# Patient Record
Sex: Male | Born: 1945 | Race: Asian | Hispanic: No | Marital: Married | Smoking: Never smoker
Health system: Southern US, Community
[De-identification: ages and names within clinical notes are randomized; demographics above are authoritative.]

## PROBLEM LIST (undated history)

## (undated) DIAGNOSIS — I10 Essential (primary) hypertension: Secondary | ICD-10-CM

## (undated) DIAGNOSIS — Z950 Presence of cardiac pacemaker: Secondary | ICD-10-CM

---

## 2013-06-17 DIAGNOSIS — R9431 Abnormal electrocardiogram [ECG] [EKG]: Secondary | ICD-10-CM | POA: Insufficient documentation

## 2013-08-13 DIAGNOSIS — I2510 Atherosclerotic heart disease of native coronary artery without angina pectoris: Secondary | ICD-10-CM | POA: Insufficient documentation

## 2013-08-13 DIAGNOSIS — E785 Hyperlipidemia, unspecified: Secondary | ICD-10-CM | POA: Insufficient documentation

## 2013-08-13 DIAGNOSIS — I447 Left bundle-branch block, unspecified: Secondary | ICD-10-CM | POA: Insufficient documentation

## 2013-08-13 DIAGNOSIS — I429 Cardiomyopathy, unspecified: Secondary | ICD-10-CM | POA: Insufficient documentation

## 2014-04-20 DIAGNOSIS — I6381 Other cerebral infarction due to occlusion or stenosis of small artery: Secondary | ICD-10-CM | POA: Insufficient documentation

## 2015-05-18 DIAGNOSIS — I498 Other specified cardiac arrhythmias: Secondary | ICD-10-CM | POA: Insufficient documentation

## 2018-08-13 DIAGNOSIS — Z950 Presence of cardiac pacemaker: Secondary | ICD-10-CM

## 2018-08-13 HISTORY — DX: Presence of cardiac pacemaker: Z95.0

## 2018-09-13 ENCOUNTER — Encounter (HOSPITAL_COMMUNITY): Payer: Self-pay

## 2018-09-13 ENCOUNTER — Emergency Department (HOSPITAL_COMMUNITY): Payer: Medicare Other

## 2018-09-13 ENCOUNTER — Observation Stay (HOSPITAL_COMMUNITY)
Admission: EM | Admit: 2018-09-13 | Discharge: 2018-09-14 | Disposition: A | Discharge status: Home / Self Care | Payer: Medicare Other | Attending: Family Medicine | Admitting: Family Medicine

## 2018-09-13 ENCOUNTER — Ambulatory Visit (HOSPITAL_COMMUNITY)
Admission: EM | Admit: 2018-09-13 | Discharge: 2018-09-13 | Disposition: A | Discharge status: Home / Self Care | Payer: Medicare Other | Source: Home / Self Care

## 2018-09-13 ENCOUNTER — Other Ambulatory Visit: Payer: Self-pay

## 2018-09-13 DIAGNOSIS — Z950 Presence of cardiac pacemaker: Secondary | ICD-10-CM | POA: Diagnosis not present

## 2018-09-13 DIAGNOSIS — I2510 Atherosclerotic heart disease of native coronary artery without angina pectoris: Secondary | ICD-10-CM | POA: Diagnosis not present

## 2018-09-13 DIAGNOSIS — I10 Essential (primary) hypertension: Secondary | ICD-10-CM | POA: Diagnosis not present

## 2018-09-13 DIAGNOSIS — R42 Dizziness and giddiness: Secondary | ICD-10-CM | POA: Diagnosis present

## 2018-09-13 DIAGNOSIS — Z7982 Long term (current) use of aspirin: Secondary | ICD-10-CM | POA: Insufficient documentation

## 2018-09-13 DIAGNOSIS — D1779 Benign lipomatous neoplasm of other sites: Secondary | ICD-10-CM | POA: Insufficient documentation

## 2018-09-13 DIAGNOSIS — M419 Scoliosis, unspecified: Secondary | ICD-10-CM | POA: Insufficient documentation

## 2018-09-13 DIAGNOSIS — Z79899 Other long term (current) drug therapy: Secondary | ICD-10-CM | POA: Insufficient documentation

## 2018-09-13 DIAGNOSIS — G459 Transient cerebral ischemic attack, unspecified: Secondary | ICD-10-CM | POA: Diagnosis not present

## 2018-09-13 DIAGNOSIS — F329 Major depressive disorder, single episode, unspecified: Secondary | ICD-10-CM | POA: Insufficient documentation

## 2018-09-13 DIAGNOSIS — Z8679 Personal history of other diseases of the circulatory system: Secondary | ICD-10-CM | POA: Insufficient documentation

## 2018-09-13 DIAGNOSIS — R202 Paresthesia of skin: Secondary | ICD-10-CM | POA: Diagnosis not present

## 2018-09-13 DIAGNOSIS — N400 Enlarged prostate without lower urinary tract symptoms: Secondary | ICD-10-CM | POA: Insufficient documentation

## 2018-09-13 DIAGNOSIS — G309 Alzheimer's disease, unspecified: Secondary | ICD-10-CM | POA: Insufficient documentation

## 2018-09-13 DIAGNOSIS — F32A Depression, unspecified: Secondary | ICD-10-CM | POA: Insufficient documentation

## 2018-09-13 HISTORY — DX: Essential (primary) hypertension: I10

## 2018-09-13 HISTORY — DX: Presence of cardiac pacemaker: Z95.0

## 2018-09-13 LAB — COMPREHENSIVE METABOLIC PANEL
ALBUMIN: 3.7 g/dL (ref 3.5–5.0)
ALK PHOS: 50 U/L (ref 38–126)
ALT: 20 U/L (ref 0–44)
AST: 30 U/L (ref 15–41)
Anion gap: 9 (ref 5–15)
BUN: 13 mg/dL (ref 8–23)
CALCIUM: 9.1 mg/dL (ref 8.9–10.3)
CO2: 25 mmol/L (ref 22–32)
Chloride: 108 mmol/L (ref 98–111)
Creatinine, Ser: 1.16 mg/dL (ref 0.61–1.24)
GFR calc Af Amer: >60 mL/min (ref >60)
GFR calc non Af Amer: >60 mL/min (ref >60)
GLUCOSE: 94 mg/dL (ref 70–99)
POTASSIUM: 3.9 mmol/L (ref 3.5–5.1)
Sodium: 142 mmol/L (ref 135–145)
Total Bilirubin: 0.9 mg/dL (ref 0.3–1.2)
Total Protein: 6.7 g/dL (ref 6.5–8.1)

## 2018-09-13 LAB — I-STAT CHEM 8, ED
BUN: 15 mg/dL (ref 8–23)
CREATININE: 1.1 mg/dL (ref 0.61–1.24)
Calcium, Ion: 1.2 mmol/L (ref 1.15–1.40)
Chloride: 106 mmol/L (ref 98–111)
Glucose, Bld: 90 mg/dL (ref 70–99)
HCT: 43 % (ref 39.0–52.0)
Hemoglobin: 14.6 g/dL (ref 13.0–17.0)
Potassium: 3.8 mmol/L (ref 3.5–5.1)
Sodium: 142 mmol/L (ref 135–145)
TCO2: 24 mmol/L (ref 22–32)

## 2018-09-13 LAB — DIFFERENTIAL
ABS IMMATURE GRANULOCYTES: 0 10*3/uL (ref 0.0–0.1)
Basophils Absolute: 0 10*3/uL (ref 0.0–0.1)
Basophils Relative: 1 %
Eosinophils Absolute: 0.2 10*3/uL (ref 0.0–0.7)
Eosinophils Relative: 3 %
IMMATURE GRANULOCYTES: 0 %
LYMPHS PCT: 34 %
Lymphs Abs: 2.7 10*3/uL (ref 0.7–4.0)
Monocytes Absolute: 0.7 10*3/uL (ref 0.1–1.0)
Monocytes Relative: 9 %
NEUTROS ABS: 4.3 10*3/uL (ref 1.7–7.7)
Neutrophils Relative %: 53 %

## 2018-09-13 LAB — CBC
HEMATOCRIT: 44.2 % (ref 39.0–52.0)
Hemoglobin: 14.6 g/dL (ref 13.0–17.0)
MCH: 29.1 pg (ref 26.0–34.0)
MCHC: 33 g/dL (ref 30.0–36.0)
MCV: 88.2 fL (ref 78.0–100.0)
Platelets: 210 10*3/uL (ref 150–400)
RBC: 5.01 MIL/uL (ref 4.22–5.81)
RDW: 12.4 % (ref 11.5–15.5)
WBC: 8 10*3/uL (ref 4.0–10.5)

## 2018-09-13 LAB — I-STAT TROPONIN, ED: Troponin i, poc: 0.04 ng/mL (ref 0.00–0.08)

## 2018-09-13 LAB — APTT: aPTT: 31 seconds (ref 24–36)

## 2018-09-13 LAB — PROTIME-INR
INR: 1.12
Prothrombin Time: 14.3 seconds (ref 11.4–15.2)

## 2018-09-13 MED ORDER — INFLUENZA VAC SPLIT HIGH-DOSE 0.5 ML IM SUSY
0.5000 mL | PREFILLED_SYRINGE | INTRAMUSCULAR | Status: DC
  Filled 2018-09-13: qty 0.5

## 2018-09-13 MED ORDER — ASPIRIN 325 MG PO TABS
325.0000 mg | ORAL_TABLET | Freq: Every day | ORAL | Status: DC
  Administered 2018-09-13 – 2018-09-14 (×2): 325 mg via ORAL
  Filled 2018-09-13 (×2): qty 1

## 2018-09-13 MED ORDER — ENOXAPARIN SODIUM 40 MG/0.4ML ~~LOC~~ SOLN
40.0000 mg | SUBCUTANEOUS | Status: DC
  Administered 2018-09-13: 40 mg via SUBCUTANEOUS
  Filled 2018-09-13: qty 0.4

## 2018-09-13 MED ORDER — MEMANTINE HCL 5 MG PO TABS
5.0000 mg | ORAL_TABLET | Freq: Two times a day (BID) | ORAL | Status: DC
  Administered 2018-09-13 – 2018-09-14 (×2): 5 mg via ORAL
  Filled 2018-09-13 (×3): qty 1

## 2018-09-13 MED ORDER — STROKE: EARLY STAGES OF RECOVERY BOOK
Freq: Once | Status: AC
  Administered 2018-09-13: 22:00:00
  Filled 2018-09-13: qty 1

## 2018-09-13 MED ORDER — LOSARTAN POTASSIUM 50 MG PO TABS: 50.0000 mg | ORAL_TABLET | Freq: Every day | ORAL | Status: DC

## 2018-09-13 MED ORDER — ASPIRIN 300 MG RE SUPP: 300.0000 mg | Freq: Every day | RECTAL | Status: DC

## 2018-09-13 MED ORDER — ACETAMINOPHEN 650 MG RE SUPP: 650.0000 mg | RECTAL | Status: DC | PRN

## 2018-09-13 MED ORDER — ACETAMINOPHEN 325 MG PO TABS: 650.0000 mg | ORAL_TABLET | ORAL | Status: DC | PRN

## 2018-09-13 MED ORDER — ACETAMINOPHEN 160 MG/5ML PO SOLN: 650.0000 mg | ORAL | Status: DC | PRN

## 2018-09-13 MED ORDER — HYDRALAZINE HCL 20 MG/ML IJ SOLN
2.0000 mg | INTRAMUSCULAR | Status: DC | PRN
  Administered 2018-09-13: 2 mg via INTRAVENOUS
  Filled 2018-09-13: qty 1

## 2018-09-13 MED ORDER — PNEUMOCOCCAL VAC POLYVALENT 25 MCG/0.5ML IJ INJ: 0.5000 mL | INJECTION | INTRAMUSCULAR | Status: DC

## 2018-09-13 NOTE — ED Notes (Signed)
Per admitting MD, neuro to be consulted before BP meds ordered. MD informed of MRI being cancelled.

## 2018-09-13 NOTE — ED Notes (Signed)
Pacemaker interrogated. 

## 2018-09-13 NOTE — H&P (Addendum)
[facility] Admission History and Physical Service Pager: [phone]  Patient name: Douglas Duffy Medical record number: [id number] Date of birth: 01-Apr-1946 Age: 72 y.o. Gender: male  Primary Care Provider: Patient, No Pcp Per Consultants: Neurology Code Status: Full (confirmed in ED)  Chief Complaint: dizzy, L left weakness  Assessment and Plan: Douglas Duffy is a 72 y.o. male presenting with dizziness and left leg weakness. PMH is significant for HTN, CAD,  bradyarrhythmia s/p pacemaker, h/o first degree heart block, cardiomyopathy, scoliosis, Alzheimer's disease, chronic back pain, BPH.   TIA: Had 10 min episode where he felt he could not use leg with weakness and numbness. MRI 10/2016 showed small chronic infarction in the left basal ganglia first noted on MRI in 2014. Patient unable to have MRI imaging here with recent pacemaker placement. CT head w/o with signs of old infarct and no hemorrhage. No focal deficits on exam. Patient at baseline per wife. Has mild dementia but still able to perform all ADL's. Patient compliant with ASA 81 at home and zocor 10 daily. Risk factor for stroke is HTN.  - Admit to telemetry, attending Dr. Erin Hearing - Neuro consulted in ED, appreciate recs - MRI brain unable to be obtained as patient had pacer placed 4 weeks ago (must be 8 weeks) - Patient will likely need CTA given inability to obtain MRI, will await neuro recs - Regular diet, passed swallow screen - vitals per unit - PT/OT/SLP to eval and treat  - Await neuro recs regarding permissive htn in absence on MRI imaging- Hydral 2 mg IV prn for >200/>110 - ECHO - Lipid panel, A1c in am   HTN: BP up to 217/90 in ED. Patient on losartan at home. - Continue home losartan, pending changes from neuro - Hydral 2 mg IV prn for >200/>110.  - Await neuro recs regarding permissive htn in absence on MRI imaging - Monitor BP  CAD: Per chart review. Patient on ASA 81  daily - ASA 325 mg - neuro recs for DAPT  Bradyarrhythmia s/p pacemaker  h/o 1st degree heart block: - Telemetry monitoring  Alzheimer's Disease: Patient with mild symptoms. Performs all ADL's. Follows in [place]. Baseline appears to be patient repeats himself often but is A&Ox3.  - Monitor for changes in mentation  Scoliosis with back pain: Not on any chronic medication - Tylenol prn  BPH: per chart review. Patient's wife reports she is unaware of this diagnosis. - Monitor intake and output  HLD: On simvastatin 10mg  and omega 3's - Increase to high intensity statin rosuvastatin 20mg   - Lipid panel in am  FEN/GI: NPO pending swallow study Prophylaxis: Lovenox  Disposition: admit to telemetry  History of Present Illness:  Douglas Duffy is a 72 y.o. male presenting with dizziness and lightheadedness earlier today. HE was sitting in a chair and started to feel dizzy and lightheaded. He went to stand and his left leg was weak and numb. He started to try to walk quickly across the floor and after about 10 minutes, his leg was normal again. Denies any speech changes, chest pain, sob. Patient does not remember this ever happening before but wife reports he has had a TIA in the past, maybe in 2014.   Patient is from [place] and is visiting his daughter for 2 weeks. Recently on a flight. Patient has been complaint with all his home meds and took his medication this morning as he normally does.   Review Of Systems: Per HPI  with the following additions:   Review of Systems  Constitutional: Negative for chills and fever.  HENT: Negative for sore throat.   Eyes: Negative for blurred vision and double vision.  Respiratory: Negative for cough and shortness of breath.   Cardiovascular: Negative for chest pain.  Gastrointestinal: Negative for constipation, diarrhea, nausea and vomiting.  Genitourinary: Negative for dysuria.  Musculoskeletal: Negative for falls.  Neurological: Positive for  dizziness, sensory change, focal weakness and weakness. Negative for tingling, speech change, loss of consciousness and headaches.    Patient Active Problem List   Diagnosis Date Noted  . TIA (transient ischemic attack) 09/13/2018  . Benign prostatic hyperplasia 09/13/2018  . 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 Depression 09/13/2018  . HTN (hypertension) 09/13/2018  . Scoliosis 09/13/2018  . Bradyarrhythmia 05/18/2015  . Multiple lacunar infarcts ([place]) 04/20/2014  . CAD (coronary artery disease) 08/13/2013  . Cardiomyopathy ([place]) 08/13/2013  . Hyperlipidemia 08/13/2013  . LBBB (left bundle branch block) 08/13/2013  . Abnormal EKG 06/17/2013   Past Medical History: Past Medical History:  Diagnosis Date  . Hypertension   . Pacemaker 08/13/2018    Past Surgical History: History reviewed. No pertinent surgical history.  Social History: Social History   Tobacco Use  . Smoking status: Never Smoker  Substance Use Topics  . Alcohol use: Yes    Frequency: Never    Comment: rare  . Drug use: Never   Additional social history:  Drinks a beer per week, never smoker, never illicit drug user Please also refer to relevant sections of EMR.  Family History: Brother Deceased (at age 18) Alcohol (substance abuse), Cancer, Other, Heart Disease, Depression  Father Deceased (at age 82) (MI) CAD, Alcohol  Maternal Grandmother Deceased(at age 23)  Maternal Uncle Deceased (at age 95)  Mother Deceased (at age 94) (enlarged heart) Heart Disease  Sister Deceased HIV No History Breast Cancer, Colorectal Cancer, Diabetes, Prostate Cancer, Hypertension, Cancer, Prostate   Allergies and Medications: Allergies  Allergen Reactions  . Lobster [Shellfish Allergy] Itching   No current facility-administered medications on file prior to encounter.    No current outpatient medications on file prior to encounter.    Objective: BP (!) 205/88 (BP Location: Right Arm)   Pulse (!) 54   Temp 98.1 F (36.7  C) (Oral)   Resp 20   Ht 5\' 8"  (1.727 m)   Wt 67.6 kg   SpO2 96%   BMI 22.66 kg/m  Exam: General: NAD, pleasant Eyes: PERRL, EOMI, no conjunctival pallor or injection ENTM: Moist mucous membranes, no pharyngeal erythema or exudate Neck: Supple, no LAD Cardiovascular: RRR, no m/r/g, no LE edema Respiratory: CTA BL, normal work of breathing Gastrointestinal: soft, nontender, nondistended, normoactive BS MSK: moves 4 extremities equally Derm: no rashes appreciated Neuro: CN II-XII grossly intact, 5/5 strength in BLUE and BLLE, gross sensation intact throughout in BLLE and BLUE Psych: AOx3, appropriate affect  Labs and Imaging: CBC BMET  Recent Labs  Lab 09/13/18 1443 09/13/18 1505  WBC 8.0  --   HGB 14.6 14.6  HCT 44.2 43.0  PLT 210  --    Recent Labs  Lab 09/13/18 1443 09/13/18 1505  NA 142 142  K 3.9 3.8  CL 108 106  CO2 25  --   BUN 13 15  CREATININE 1.16 1.10  GLUCOSE 94 90  CALCIUM 9.1  --     Ct Head Wo Contrast  Result Date: 09/13/2018 CLINICAL DATA:  Lightheaded, fall EXAM: CT HEAD WITHOUT CONTRAST TECHNIQUE:  Contiguous axial images were obtained from the base of the skull through the vertex without intravenous contrast. COMPARISON:  None. FINDINGS: Brain: No evidence of acute infarction, hemorrhage, hydrocephalus, extra-axial collection or mass lesion/mass effect. Subcortical white matter and periventricular small vessel ischemic changes. Global cortical atrophy. Old left basal ganglia lacunar infarct. Vascular: Intracranial atherosclerosis. Skull: Normal. Negative for fracture or focal lesion. Sinuses/Orbits: The visualized paranasal sinuses are essentially clear. The mastoid air cells are unopacified. Other: None. IMPRESSION: No evidence of acute intracranial abnormality. Atrophy with small vessel ischemic changes. Old left basal ganglia lacunar infarct. Electronically Signed   By: Julian Hy M.D.   On: 09/13/2018 15:30     Mairi Stagliano, [place],  DO 09/13/2018, 6:19 PM PGY-2, [place] Intern pager: [phone], text pages welcome

## 2018-09-13 NOTE — ED Provider Notes (Signed)
[facility] EMERGENCY DEPARTMENT Provider Note   CSN: [id number] Arrival date & time: 09/13/18  1421     History   Chief Complaint Chief Complaint  Patient presents with  . Dizziness  . Left Leg Weakness    HPI Douglas Duffy is a 72 y.o. male.  Patient with history of bradycardia arrhythmia status post pacemaker, hypertension who presents to the ED with strokelike symptoms.  Patient states that earlier today he had an episode of dizziness and left leg weakness with sensation changes that lasted about 10 minutes and then resolved.  Patient has a history of dementia but also has had TIAs in the past according to his wife.  Patient had recent pacemaker placed in [place] where he is from.  He denies any chest pain, shortness of breath.  Patient is not on any blood thinners.  Patient did not state he felt like he was going to pass out but rather that the room is spinning.  The history is provided by the patient.  Neurologic Problem  This is a new problem. The current episode started 6 to 12 hours ago. The problem occurs rarely. The problem has been resolved. Pertinent negatives include no chest pain, no abdominal pain, no headaches and no shortness of breath. Nothing aggravates the symptoms. The symptoms are relieved by rest. He has tried nothing for the symptoms. The treatment provided no relief.    Past Medical History:  Diagnosis Date  . Hypertension   . Pacemaker 08/13/2018    Patient Active Problem List   Diagnosis Date Noted  . TIA (transient ischemic attack) 09/13/2018  . Benign prostatic hyperplasia 09/13/2018  . 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 Depression 09/13/2018  . HTN (hypertension) 09/13/2018  . Scoliosis 09/13/2018  . Bradyarrhythmia 05/18/2015  . Multiple lacunar infarcts ([place]) 04/20/2014  . CAD (coronary artery disease) 08/13/2013  . Cardiomyopathy ([place]) 08/13/2013  . Hyperlipidemia 08/13/2013  . LBBB (left bundle branch block) 08/13/2013    . Abnormal EKG 06/17/2013    History reviewed. No pertinent surgical history.      Home Medications    Prior to Admission medications   Medication Sig Start Date End Date Taking? Authorizing Provider  aspirin EC 81 MG tablet Take 81 mg by mouth daily.   Yes [provider]  Cholecalciferol (VITAMIN D) 2000 units tablet Take 2,000 Units by mouth daily.   Yes [provider]  losartan (COZAAR) 50 MG tablet Take 50 mg by mouth daily. 07/10/18  Yes [provider]  memantine (NAMENDA) 5 MG tablet Take 5 mg by mouth 2 (two) times daily. 07/19/18  Yes [provider]  Omega-3 Fatty Acids (FISH OIL) 1000 MG CAPS Take 1,000 mg by mouth daily.   Yes [provider]  simvastatin (ZOCOR) 10 MG tablet Take 10 mg by mouth at bedtime. 09/08/18  Yes [provider]  TURMERIC PO Take 1 tablet by mouth daily.   Yes [provider]    Family History History reviewed. No pertinent family history.  Social History Social History   Tobacco Use  . Smoking status: Never Smoker  Substance Use Topics  . Alcohol use: Yes    Frequency: Never    Comment: rare  . Drug use: Never     Allergies   Lobster [shellfish allergy] and Ace inhibitors   Review of Systems Review of Systems  Constitutional: Negative for chills and fever.  HENT: Negative for ear pain and sore throat.   Eyes:  Negative for pain and visual disturbance.  Respiratory: Negative for cough and shortness of breath.   Cardiovascular: Negative for chest pain and palpitations.  Gastrointestinal: Negative for abdominal pain and vomiting.  Genitourinary: Negative for dysuria and hematuria.  Musculoskeletal: Negative for arthralgias and back pain.  Skin: Negative for color change and rash.  Neurological: Positive for dizziness, weakness (LLE) and numbness (LLE). Negative for tremors, seizures, syncope, facial asymmetry, speech difficulty, light-headedness and headaches.  All  other systems reviewed and are negative.    Physical Exam Updated Vital Signs  ED Triage Vitals  Enc Vitals Group     BP 09/13/18 1427 (!) 173/93     Pulse Rate 09/13/18 1427 (!) 50     Resp 09/13/18 1427 18     Temp 09/13/18 1427 98.1 F (36.7 C)     Temp Source 09/13/18 1427 Oral     SpO2 09/13/18 1427 97 %     Weight 09/13/18 1431 149 lb (67.6 kg)     Height 09/13/18 1431 5\' 8"  (1.727 m)     Head Circumference --      Peak Flow --      Pain Score 09/13/18 1431 0     Pain Loc --      Pain Edu? --      Excl. in [place]? --     Physical Exam  Constitutional: He is oriented to person, place, and time. He appears well-developed and well-nourished.  HENT:  Head: Normocephalic and atraumatic.  Eyes: Pupils are equal, round, and reactive to light. Conjunctivae and EOM are normal.  Neck: Normal range of motion. Neck supple.  Cardiovascular: Normal rate, regular rhythm, normal heart sounds and intact distal pulses.  No murmur heard. Pulmonary/Chest: Effort normal and breath sounds normal. No respiratory distress.  Abdominal: Soft. There is no tenderness.  Musculoskeletal: Normal range of motion. He exhibits no edema.  Neurological: He is alert and oriented to person, place, and time. No cranial nerve deficit or sensory deficit. He exhibits normal muscle tone. Coordination normal.  5+/5 strength, normal sensation, no drift, normal finger to nose finger  Skin: Skin is warm and dry.  Psychiatric: He has a normal mood and affect.  Nursing note and vitals reviewed.    ED Treatments / Results  Labs (all labs ordered are listed, but only abnormal results are displayed) Labs Reviewed  PROTIME-INR  APTT  CBC  DIFFERENTIAL  COMPREHENSIVE METABOLIC PANEL  HEMOGLOBIN A1C  LIPID PANEL  I-STAT TROPONIN, ED  I-STAT CHEM 8, ED  CBG MONITORING, ED    EKG EKG Interpretation  Date/Time:  Saturday September 13 2018 14:38:06 EDT Ventricular Rate:  50 PR Interval:  286 QRS  Duration: 142 QT Interval:  454 QTC Calculation: 413 R Axis:   -49 Text Interpretation:  Atrial-paced rhythm with prolonged AV conduction Left axis deviation Left bundle branch block Abnormal ECG Confirmed by Lennice Sites [phone]) on 09/13/2018 5:01:59 PM   Radiology Ct Head Wo Contrast  Result Date: 09/13/2018 CLINICAL DATA:  Lightheaded, fall EXAM: CT HEAD WITHOUT CONTRAST TECHNIQUE: Contiguous axial images were obtained from the base of the skull through the vertex without intravenous contrast. COMPARISON:  None. FINDINGS: Brain: No evidence of acute infarction, hemorrhage, hydrocephalus, extra-axial collection or mass lesion/mass effect. Subcortical white matter and periventricular small vessel ischemic changes. Global cortical atrophy. Old left basal ganglia lacunar infarct. Vascular: Intracranial atherosclerosis. Skull: Normal. Negative for fracture or focal lesion. Sinuses/Orbits: The visualized paranasal sinuses are essentially clear. The mastoid air  cells are unopacified. Other: None. IMPRESSION: No evidence of acute intracranial abnormality. Atrophy with small vessel ischemic changes. Old left basal ganglia lacunar infarct. Electronically Signed   By: Julian Hy M.D.   On: 09/13/2018 15:30    Procedures Procedures (including critical care time)  Medications Ordered in ED Medications  memantine (NAMENDA) tablet 5 mg (5 mg Oral Given 09/13/18 2152)  losartan (COZAAR) tablet 50 mg (has no administration in time range)  acetaminophen (TYLENOL) tablet 650 mg (has no administration in time range)    Or  acetaminophen (TYLENOL) solution 650 mg (has no administration in time range)    Or  acetaminophen (TYLENOL) suppository 650 mg (has no administration in time range)  enoxaparin (LOVENOX) injection 40 mg (40 mg Subcutaneous Given 09/13/18 2152)  aspirin suppository 300 mg ( Rectal See Alternative 09/13/18 2201)    Or  aspirin tablet 325 mg (325 mg Oral Given 09/13/18 2201)   hydrALAZINE (APRESOLINE) injection 2 mg (2 mg Intravenous Given 09/13/18 2153)   stroke: mapping our early stages of recovery book ( Does not apply Given 09/13/18 2153)     Initial Impression / Assessment and Plan / ED Course  I have reviewed the triage vital signs and the nursing notes.  Pertinent labs & imaging results that were available during my care of the patient were reviewed by me and considered in my medical decision making (see chart for details).     Douglas Duffy is a 72 year old male with history of dementia, hypertension, status post pacemaker for bradycardia arrhythmia who presents to the ED with strokelike symptoms.  Patient with mild hypertension upon arrival but otherwise normal vitals.  Patient with episode of dizziness, left lower leg weakness and numbness that resolved after 10 minutes.  Patient has a history of TIAs in the past. Patient is visiting from [place].  He had a pacemaker placed several weeks ago.  He denies any chest pain, shortness of breath.  Patient is asymptomatic upon my evaluation.  He is neurologically intact.  Patient already with lab work and head CT done prior to my evaluation.  Head CT shows no acute findings.  No significant electrolyte abnormality, kidney injury, leukocytosis.  Patient with troponin within normal limits.  EKG showed atrial paced rhythm.  No prior to compare to.  Pacemaker was evaluated and showed no acute findings after talking with rep from [employer].  Doubt cardiac cause.  Concern for possible TIA and patient to be admitted for TIA work-up.  MRI of the head and brain were ordered but may need pacer clearance.  Neurologist was consulted as well and will come down to the ED to evaluate the patient.  Patient admitted to medicine service for further care.  Final Clinical Impressions(s) / ED Diagnoses   Final diagnoses:  TIA (transient ischemic attack)    ED Discharge Orders    None       Lennice Sites, DO 09/13/18 2221

## 2018-09-13 NOTE — Consult Note (Signed)
Referring Physician: Dr. Erin Hearing    Chief Complaint: Dizziness and transient left leg numbness with weakness  HPI: Douglas Duffy is an 72 y.o. male who presented to the ED this evening with dizziness and LLE weakness/numbness. The episode started at noon and lasted 10-15 minutes, during which he could not "use his leg" due to weakness in addition to a sensation of numbness. He has had no further episodes. Exam by [facility] Medicine team in the ED was nonfocal. He was noted to be hypertensive in the ED. BPs have ranged from 173/93 to 217/92.   Home medications include ASA 81 mg qd and Zocor.   CT head in the ED shows no evidence of acute intracranial abnormality. There is atrophy with small vessel ischemic changes and an old left basal ganglia lacunar infarct, which was also visible on the prior MRI from November 2017. 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  MRI cannot be obtained due to pacemaker placement 4 weeks ago. If it is MRI compatible, an MRI can be obtained after an additional 4 weeks has elapsed.   Neurology was consulted for possible TIA.   Past Medical History:  Diagnosis Date  . Hypertension   . Pacemaker 08/13/2018    History reviewed. No pertinent surgical history.  History reviewed. No pertinent family history. Social History:  reports that he has never smoked. He does not have any smokeless tobacco history on file. He reports that he drinks alcohol. He reports that he does not use drugs.  Allergies:  Allergies  Allergen Reactions  . Lobster [Shellfish Allergy] Itching  . Ace Inhibitors Cough    Currently taking losartan    Medications:  Prior to Admission:  Medications Prior to Admission  Medication Sig Dispense Refill Last Dose  . aspirin EC 81 MG tablet Take 81 mg by mouth daily.   09/13/2018 at am  . Cholecalciferol (VITAMIN D) 2000 units tablet Take 2,000 Units by  mouth daily.   09/12/2018 at Unknown time  . losartan (COZAAR) 50 MG tablet Take 50 mg by mouth daily.  0 09/13/2018 at am  . memantine (NAMENDA) 5 MG tablet Take 5 mg by mouth 2 (two) times daily.  0 09/13/2018 at am  . Omega-3 Fatty Acids (FISH OIL) 1000 MG CAPS Take 1,000 mg by mouth daily.   09/12/2018 at Unknown time  . simvastatin (ZOCOR) 10 MG tablet Take 10 mg by mouth at bedtime.  0 09/12/2018 at pm  . TURMERIC PO Take 1 tablet by mouth daily.   09/12/2018 at Unknown time   Scheduled: . aspirin  300 mg Rectal Daily   Or  . aspirin  325 mg Oral Daily  . enoxaparin (LOVENOX) injection  40 mg Subcutaneous Q24H  . [START ON 09/14/2018] Influenza vac split quadrivalent PF  0.5 mL Intramuscular Tomorrow-1000  . [START ON 09/14/2018] losartan  50 mg Oral Daily  . memantine  5 mg Oral BID  . [START ON 09/14/2018] pneumococcal 23 valent vaccine  0.5 mL Intramuscular Tomorrow-1000    ROS: No headache, vision change, dysphasia, facial droop, upper extremity weakness or CP. Other ROS as per HPI.   Physical Examination: Blood pressure (!) 212/92, pulse (!) 51, temperature 97.7 F (36.5 C), temperature source Oral, resp. rate 18, height _0  (1.727 m), weight 67.6 kg, SpO2 98 %.  HEENT: [place]/AT Lungs: Respirations unlabored Ext: No edema. Warm and well perfused all 4  extremities.   Neurologic Examination: Mental Status: Alert. Oriented ro self and hospital, not city, state, year, month or day. Pleasant and cooperative. Often repeats the same information during interview. Speech occasionally hesitant but without errors of grammar or syntax. Has trouble with repetition and response is significantly delayed when he correctly follows a 3-step directional command. Visual identification and naming intact for thumb and pinky; identifies but cannot name index finger. Cranial Nerves: II:  Visual fields intact without extinction to DSS. PERRL.   III,IV, VI: ptosis not present, EOMI without nystagmus.  V,VII:  Temp sensation equal bilaterally. No facial droop.  VIII: Hearing intact to voice.  IX,X: No hypophonia/hoarseness XI:  Symmetric XII: midline tongue extension  Motor: Right : Upper extremity   5/5    Left:     Upper extremity   5/5  Lower extremity   5/5     Lower extremity   5/5 Normal tone throughout; no atrophy noted No pronator drift.  Sensory: Temp and light touch intact proximally and distally all 4 extremities. Focused exam of LLE sensation is normal.  No extinction.  Deep Tendon Reflexes:  2+ bilateral biceps, brachioradialis, patellae and achilles.  Toes downgoing bilaterally.  Cerebellar: No ataxia with FNF and H-S bilaterally.  Gait: Deferred  Results for orders placed or performed during the hospital encounter of 09/13/18 (from the past 48 hour(s))  Protime-INR     Status: None   Collection Time: 09/13/18  2:43 PM  Result Value Ref Range   Prothrombin Time 14.3 11.4 - 15.2 seconds   INR 1.12     Comment: Performed at [facility], [place] [address], [place], [place] [address]  APTT     Status: None   Collection Time: 09/13/18  2:43 PM  Result Value Ref Range   aPTT 31 24 - 36 seconds    Comment: Performed at [facility] [address] [place] [address]  CBC     Status: None   Collection Time: 09/13/18  2:43 PM  Result Value Ref Range   WBC 8.0 4.0 - 10.5 K/uL   RBC 5.01 4.22 - 5.81 MIL/uL   Hemoglobin 14.6 13.0 - 17.0 g/dL   HCT 44.2 39.0 - 52.0 %   MCV 88.2 78.0 - 100.0 fL   MCH 29.1 26.0 - 34.0 pg   MCHC 33.0 30.0 - 36.0 g/dL   RDW 12.4 11.5 - 15.5 %   Platelets 210 150 - 400 K/uL    Comment: Performed at [facility] [address], [place], [place] [address]  Differential     Status: None   Collection Time: 09/13/18  2:43 PM  Result Value Ref Range   Neutrophils Relative % 53 %   Neutro Abs 4.3 1.7 - 7.7 K/uL   Lymphocytes Relative 34 %   Lymphs Abs 2.7 0.7 - 4.0 K/uL   Monocytes Relative 9 %   Monocytes Absolute 0.7 0.1 -  1.0 K/uL   Eosinophils Relative 3 %   Eosinophils Absolute 0.2 0.0 - 0.7 K/uL   Basophils Relative 1 %   Basophils Absolute 0.0 0.0 - 0.1 K/uL   Immature Granulocytes 0 %   Abs Immature Granulocytes 0.0 0.0 - 0.1 K/uL    Comment: Performed at [facility] [address] [place], [place] [address]  Comprehensive metabolic panel     Status: None   Collection Time: 09/13/18  2:43 PM  Result Value Ref Range  Sodium 142 135 - 145 mmol/L   Potassium 3.9 3.5 - 5.1 mmol/L   Chloride 108 98 - 111 mmol/L   CO2 25 22 - 32 mmol/L   Glucose, Bld 94 70 - 99 mg/dL   BUN 13 8 - 23 mg/dL   Creatinine, Ser 1.16 0.61 - 1.24 mg/dL   Calcium 9.1 8.9 - 10.3 mg/dL   Total Protein 6.7 6.5 - 8.1 g/dL   Albumin 3.7 3.5 - 5.0 g/dL   AST 30 15 - 41 U/L   ALT 20 0 - 44 U/L   Alkaline Phosphatase 50 38 - 126 U/L   Total Bilirubin 0.9 0.3 - 1.2 mg/dL   GFR calc non Af Amer >60 >60 mL/min   GFR calc Af Amer >60 >60 mL/min    Comment: (NOTE) The eGFR has been calculated using the CKD EPI equation. This calculation has not been validated in all clinical situations. eGFR's persistently <60 mL/min signify possible Chronic Kidney Disease.    Anion gap 9 5 - 15    Comment: Performed at [facility] [address] [place], [place] [address]  I-stat troponin, ED     Status: None   Collection Time: 09/13/18  3:03 PM  Result Value Ref Range   Troponin i, poc 0.04 0.00 - 0.08 ng/mL   Comment 3            Comment: Due to the release kinetics of cTnI, a negative result within the first hours of the onset of symptoms does not rule out myocardial infarction with certainty. If myocardial infarction is still suspected, repeat the test at appropriate intervals.   I-Stat Chem 8, ED     Status: None   Collection Time: 09/13/18  3:05 PM  Result Value Ref Range   Sodium 142 135 - 145 mmol/L   Potassium 3.8 3.5 - 5.1 mmol/L   Chloride 106 98 - 111 mmol/L   BUN 15 8 - 23 mg/dL   Creatinine, Ser 1.10  0.61 - 1.24 mg/dL   Glucose, Bld 90 70 - 99 mg/dL   Calcium, Ion 1.20 1.15 - 1.40 mmol/L   TCO2 24 22 - 32 mmol/L   Hemoglobin 14.6 13.0 - 17.0 g/dL   HCT 43.0 39.0 - 52.0 %   Ct Head Wo Contrast  Result Date: 09/13/2018 CLINICAL DATA:  Lightheaded, fall EXAM: CT HEAD WITHOUT CONTRAST TECHNIQUE: Contiguous axial images were obtained from the base of the skull through the vertex without intravenous contrast. COMPARISON:  None. FINDINGS: Brain: No evidence of acute infarction, hemorrhage, hydrocephalus, extra-axial collection or mass lesion/mass effect. Subcortical white matter and periventricular small vessel ischemic changes. Global cortical atrophy. Old left basal ganglia lacunar infarct. Vascular: Intracranial atherosclerosis. Skull: Normal. Negative for fracture or focal lesion. Sinuses/Orbits: The visualized paranasal sinuses are essentially clear. The mastoid air cells are unopacified. Other: None. IMPRESSION: No evidence of acute intracranial abnormality. Atrophy with small vessel ischemic changes. Old left basal ganglia lacunar infarct. Electronically Signed   By: Julian Hy M.D.   On: 09/13/2018 15:30    Assessment: 72 y.o. male with transient weakness and sensory numbness of LLE 1. DDx for the patient's symptoms primarily TIA versus transient compression neuropathy versus hypertensive encephalopathy.  2. Stroke Risk Factors - HTN 3. Recent pacemaker placement.  4. BP up to 217/90 in ED. Hydralazine 2 mg IV prn for BP  >200/>110 has been ordered by primary team.   Plan: 1. HgbA1c, fasting lipid panel 2. CTA of  head and neck. 3. PT consult, OT consult, Speech consult 4. TTE 5. Cardiac telemetry 6. Increase ASA to 325 mg po qd. If CTA shows moderate to severe intracranial atherosclerotic disease, add Plavix to his regimen. 7. Change Zocor to Lipitor 40 mg po qd.  8. Frequent neuro checks 9. MRI cannot be obtained due to pacemaker placement 4 weeks ago. If it is MRI compatible,  an MRI can be obtained after an additional 4 weeks has elapsed.  10. Given TIA vs HTN, modified permissive HTN protocol with treatment of BP if it is greater than 683 systolic, or more than 419 diastolic, is recommended.   Electronically signed: Dr. Kerney Elbe  09/13/2018, 9:18 PM

## 2018-09-13 NOTE — ED Triage Notes (Signed)
Pt endorse episode of dizziness and left leg numbness/weakness at 1200 today lasting 10 minutes and then resolved. No sx at this time. Hypertensive.

## 2018-09-13 NOTE — ED Notes (Signed)
Pt c/o dizziness and L leg numbness earlier today, symptmos have resolved. EKG obtained, given to Philis Fendt, PA. Philis Fendt evaluated patient and sent to er.  Pt wheeled to the ER.

## 2018-09-14 ENCOUNTER — Observation Stay (HOSPITAL_COMMUNITY): Payer: Medicare Other

## 2018-09-14 ENCOUNTER — Observation Stay (HOSPITAL_BASED_OUTPATIENT_CLINIC_OR_DEPARTMENT_OTHER): Payer: Medicare Other

## 2018-09-14 ENCOUNTER — Encounter (HOSPITAL_COMMUNITY): Payer: Self-pay | Admitting: *Deleted

## 2018-09-14 DIAGNOSIS — I361 Nonrheumatic tricuspid (valve) insufficiency: Secondary | ICD-10-CM

## 2018-09-14 DIAGNOSIS — G459 Transient cerebral ischemic attack, unspecified: Secondary | ICD-10-CM | POA: Diagnosis not present

## 2018-09-14 LAB — LIPID PANEL
CHOLESTEROL: 127 mg/dL (ref 0–200)
HDL: 57 mg/dL (ref >40)
LDL Cholesterol: 62 mg/dL (ref 0–99)
Total CHOL/HDL Ratio: 2.2 RATIO
Triglycerides: 42 mg/dL (ref <150)
VLDL: 8 mg/dL (ref 0–40)

## 2018-09-14 LAB — ECHOCARDIOGRAM COMPLETE
HEIGHTINCHES: 68 in
Weight: 2335.11 oz

## 2018-09-14 MED ORDER — HYDRALAZINE HCL 20 MG/ML IJ SOLN: 2.0000 mg | Freq: Four times a day (QID) | INTRAMUSCULAR | Status: DC | PRN

## 2018-09-14 MED ORDER — IOPAMIDOL (ISOVUE-370) INJECTION 76%
INTRAVENOUS | Status: AC
  Administered 2018-09-14: 50 mL
  Filled 2018-09-14: qty 50

## 2018-09-14 MED ORDER — ATORVASTATIN CALCIUM 40 MG PO TABS: 40.0000 mg | ORAL_TABLET | Freq: Every day | ORAL | Status: DC

## 2018-09-14 MED ORDER — CLOPIDOGREL BISULFATE 75 MG PO TABS: 75.0000 mg | ORAL_TABLET | Freq: Every day | ORAL | 0 refills | Status: AC

## 2018-09-14 MED ORDER — ATORVASTATIN CALCIUM 40 MG PO TABS: 40.0000 mg | ORAL_TABLET | Freq: Every day | ORAL | 0 refills | Status: AC

## 2018-09-14 MED ORDER — HYDRALAZINE HCL 20 MG/ML IJ SOLN: 2.0000 mg | INTRAMUSCULAR | Status: DC | PRN

## 2018-09-14 NOTE — Evaluation (Signed)
Physical Therapy Evaluation Patient Details Name: Douglas Duffy MRN: [id number] DOB: [birth year] Today's Date: 09/14/2018   History of Present Illness  Pt is a 72 y.o. male who presented to the ED after an episode of dizziness as well as numbness and inability to move his LLE. He has a PMH significant for HTN, CAD,  bradyarrhythmia s/p pacemaker, h/o first degree heart block, cardiomyopathy, scoliosis, Alzheimer's disease, chronic back pain, BPH. He had a pacemaker placed 4 weeks ago. CT negative for acute abnormalities.  Clinical Impression  Patient seen for therapy assessment.  Mobilizing well with no noted focal deficits at this time. Educated patient on BEFAST signs. No further acute PT needs. Will sign off.    Follow Up Recommendations No PT follow up    Equipment Recommendations  None recommended by PT    Recommendations for Other Services       Precautions / Restrictions Precautions Precautions: Fall Precaution Comments: History of Alzhiemer's disease.  Restrictions Weight Bearing Restrictions: No      Mobility  Bed Mobility Overal bed mobility: Independent                Transfers Overall transfer level: Independent                  Ambulation/Gait Ambulation/Gait assistance: Independent Gait Distance (Feet): 640 Feet Assistive device: None Gait Pattern/deviations: WFL(Within Functional Limits)     General Gait Details: steady with ambulation  Stairs Stairs: Yes Stairs assistance: Independent Stair Management: No rails Number of Stairs: 12 General stair comments: no difficulty  Wheelchair Mobility    Modified Rankin (Stroke Patients Only)       Balance Overall balance assessment: Mild deficits observed, not formally tested                               Standardized Balance Assessment Standardized Balance Assessment : Dynamic Gait Index   Dynamic Gait Index Level Surface: Normal Change in Gait Speed: Normal Gait  with Horizontal Head Turns: Normal Gait with Vertical Head Turns: Normal Gait and Pivot Turn: Mild Impairment Step Over Obstacle: Normal Step Around Obstacles: Mild Impairment Steps: Normal Total Score: 22       Pertinent Vitals/Pain      Home Living Family/patient expects to be discharged to:: Private residence Living Arrangements: Spouse/significant other Available Help at Discharge: Family;Available 24 hours/day Type of Home: Apartment Home Access: Stairs to enter Entrance Stairs-Rails: Right;Left(cannot reach both) Entrance Stairs-Number of Steps: 10 Home Layout: One level Home Equipment: None Additional Comments: Pt and wife are here visiting from [place].     Prior Function Level of Independence: Independent   Gait / Transfers Assistance Needed: Independent with mobility  ADL's / Homemaking Assistance Needed: Wife assists with IADL tasks but pt able to do all basic ADL and some cleaning tasks without assistance  Comments: Wife supervises due to cognitive deficits. Pt is very active at baseline.      Hand Dominance        Extremity/Trunk Assessment   Upper Extremity Assessment Upper Extremity Assessment: Overall WFL for tasks assessed    Lower Extremity Assessment Lower Extremity Assessment: Overall WFL for tasks assessed       Communication   Communication: No difficulties  Cognition Arousal/Alertness: Awake/alert Behavior During Therapy: WFL for tasks assessed/performed Overall Cognitive Status: History of cognitive impairments - at baseline  General Comments: Pt with history of Alzheimer's disease at baseline. He is oriented to his location, the situation as well as his birthdate today.       General Comments General comments (skin integrity, edema, etc.): wife present during session    Exercises     Assessment/Plan    PT Assessment Patent does not need any further PT services  PT Problem  List         PT Treatment Interventions      PT Goals (Current goals can be found in the Care Plan section)  Acute Rehab PT Goals Patient Stated Goal: to go home PT Goal Formulation: All assessment and education complete, DC therapy    Frequency     Barriers to discharge        Co-evaluation               AM-PAC PT "6 Clicks" Daily Activity  Outcome Measure Difficulty turning over in bed (including adjusting bedclothes, sheets and blankets)?: None Difficulty moving from lying on back to sitting on the side of the bed? : None Difficulty sitting down on and standing up from a chair with arms (e.g., wheelchair, bedside commode, etc,.)?: None Help needed moving to and from a bed to chair (including a wheelchair)?: None Help needed walking in hospital room?: None Help needed climbing 3-5 steps with a railing? : None 6 Click Score: 24    End of Session   Activity Tolerance: Patient tolerated treatment well Patient left: in bed;with call bell/phone within reach;with family/visitor present Nurse Communication: Mobility status PT Visit Diagnosis: Other symptoms and signs involving the nervous system (R29.898)    Time: 0092-3300 PT Time Calculation (min) (ACUTE ONLY): 11 min   Charges:   PT Evaluation $PT Eval Low Complexity: Lee, PT DPT  Board Certified Neurologic Specialist Acute Rehabilitation Services Pager [phone] Office [place] 09/14/2018, 11:28 AM

## 2018-09-14 NOTE — Progress Notes (Signed)
Nsg Discharge Note  Admit Date:  09/13/2018 Discharge date: 09/14/2018   Douglas Duffy to be D/C'd Home per MD order.  AVS completed.  Copy for chart, and copy for patient signed, and dated. Patient/caregiver able to verbalize understanding.  Discharge Medication: Allergies as of 09/14/2018      Reactions   Lobster [shellfish Allergy] Itching   Ace Inhibitors Cough   Currently taking losartan      Medication List    STOP taking these medications   simvastatin 10 MG tablet Commonly known as:  ZOCOR     TAKE these medications   aspirin EC 81 MG tablet Take 81 mg by mouth daily.   atorvastatin 40 MG tablet Commonly known as:  LIPITOR Take 1 tablet (40 mg total) by mouth daily at 6 PM.   clopidogrel 75 MG tablet Commonly known as:  PLAVIX Take 1 tablet (75 mg total) by mouth daily.   Fish Oil 1000 MG Caps Take 1,000 mg by mouth daily.   losartan 50 MG tablet Commonly known as:  COZAAR Take 50 mg by mouth daily.   memantine 5 MG tablet Commonly known as:  NAMENDA Take 5 mg by mouth 2 (two) times daily.   TURMERIC PO Take 1 tablet by mouth daily.   Vitamin D 2000 units tablet Take 2,000 Units by mouth daily.       Discharge Assessment: Vitals:   09/14/18 1133 09/14/18 1532  BP: (!) 166/82 (!) 172/77  Pulse: (!) 50 (!) 52  Resp: 18 18  Temp: 98 F (36.7 C) 98.6 F (37 C)  SpO2: 97% 100%   Skin clean, dry and intact without evidence of skin break down, no evidence of skin tears noted. IV catheter discontinued intact. Site without signs and symptoms of complications - no redness or edema noted at insertion site, patient denies c/o pain - only slight tenderness at site.  Dressing with slight pressure applied.  D/c Instructions-Education: Discharge instructions given to patient/family with verbalized understanding. D/c education completed with patient/family including follow up instructions, medication list, d/c activities limitations if indicated, with  other d/c instructions as indicated by MD - patient able to verbalize understanding, all questions fully answered. Patient instructed to return to ED, call 911, or call MD for any changes in condition.  Patient escorted via [place], and D/C home via private auto.  Eda Keys, RN 09/14/2018 4:54 PM

## 2018-09-14 NOTE — Progress Notes (Signed)
Family Medicine Teaching Service Daily Progress Note Intern Pager: [phone]  Patient name: TREQUAN MARSOLEK Medical record number: [id number] Date of birth: 02-21-1946 Age: 72 y.o. Gender: male  Primary Care Provider: Patient, No Pcp Per Consultants: Neuro Code Status: Full  Pt Overview and Major Events to Date:  MAE CIANCI is a 72 y.o. male who presented for TIA Hospital Day:   Assessment and Plan:  TIA No evidence of symptoms today.  Patient is back to neurologic baseline with no neurologic deficits on exam.  CT head without contrast showed no acute deficits, chronic ischemic disease.  He is alert and oriented only to person and president.  However this is where he normally is per wife. -Neuro recommendations appreciated -CTA head and neck pending -ASA 325 mg, atorvastatin 40 mg -PT/OT -Echocardiogram -Permissive hypertension for another 24 hours: Systolic blood pressure greater than 448, diastolic greater than 185 -Consider outpatient MRI due to recent pacemaker placement -Risk stratification labs: Lipid, A1c  Hypertension Permissive hypertension as above -PRN hydralazine -Holding home losartan  Chronic stable medical conditions  CAD:  -ASA 325 mg  Bradyarrhythmia s/p pacemaker  h/o 1st degree heart block: - Telemetry monitoring  Alzheimer's Disease:  -Pleasantly demented.  Continue Namenda  FEN/GI: Heart healthy diet PPx: Lovenox  Disposition: Inpatient for her work-up  Subjective:  Patient feels that symptoms are resolved.  Denies any dizziness, difficulty speaking swallowing.  Objective: Temp:  [97.5 F (36.4 C)-98.8 F (37.1 C)] 98 F (36.7 C) (09/15 0435) Pulse Rate:  [48-57] 56 (09/15 0435) Resp:  [13-20] 18 (09/15 0435) BP: (126-217)/(66-101) 134/73 (09/15 0435) SpO2:  [96 %-100 %] 98 % (09/15 0435) Weight:  [66.2 kg-67.6 kg] 66.2 kg (09/15 0435) Physical Exam: General: Pleasantly demented resting in bed Cardiovascular: Regular rate  and rhythm no murmurs or gallops Respiratory: Clear to auscultation bilaterally normal work of breathing Abdomen: Soft nontender nondistended Extremities: No lower extremity edema  Laboratory: Recent Labs  Lab 09/13/18 1443 09/13/18 1505  WBC 8.0  --   HGB 14.6 14.6  HCT 44.2 43.0  PLT 210  --    Recent Labs  Lab 09/13/18 1443 09/13/18 1505  NA 142 142  K 3.9 3.8  CL 108 106  CO2 25  --   BUN 13 15  CREATININE 1.16 1.10  CALCIUM 9.1  --   PROT 6.7  --   BILITOT 0.9  --   ALKPHOS 50  --   ALT 20  --   AST 30  --   GLUCOSE 94 90      Imaging/Diagnostic Tests: Ct Head Wo Contrast  Result Date: 09/13/2018 CLINICAL DATA:  Lightheaded, fall EXAM: CT HEAD WITHOUT CONTRAST TECHNIQUE: Contiguous axial images were obtained from the base of the skull through the vertex without intravenous contrast. COMPARISON:  None. FINDINGS: Brain: No evidence of acute infarction, hemorrhage, hydrocephalus, extra-axial collection or mass lesion/mass effect. Subcortical white matter and periventricular small vessel ischemic changes. Global cortical atrophy. Old left basal ganglia lacunar infarct. Vascular: Intracranial atherosclerosis. Skull: Normal. Negative for fracture or focal lesion. Sinuses/Orbits: The visualized paranasal sinuses are essentially clear. The mastoid air cells are unopacified. Other: None. IMPRESSION: No evidence of acute intracranial abnormality. Atrophy with small vessel ischemic changes. Old left basal ganglia lacunar infarct. Electronically Signed   By: Julian Hy M.D.   On: 09/13/2018 15:30    Bonnita Hollow, MD 09/14/2018, 5:20 AM PGY-2, [place] Intern pager: [phone], text pages welcome

## 2018-09-14 NOTE — Progress Notes (Signed)
STROKE TEAM PROGRESS NOTE   HISTORY OF PRESENT ILLNESS (per record) CAL GINDLESPERGER is an 72 y.o. male who presented to the ED this evening with dizziness and LLE weakness/numbness. The episode started at noon and lasted 10-15 minutes, during which he could not "use his leg" due to weakness in addition to a sensation of numbness. He has had no further episodes. Exam by [facility] Medicine team in the ED was nonfocal. He was noted to be hypertensive in the ED. BPs have ranged from 173/93 to 217/92.   Home medications include ASA 81 mg qd and Zocor.   CT head in the ED shows no evidence of acute intracranial abnormality. There is atrophy with small vessel ischemic changes and an old left basal ganglia lacunar infarct, which was also visible on the prior MRI from November 2017. 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  MRI cannot be obtained due to pacemaker placement 4 weeks ago. If it is MRI compatible, an MRI can be obtained after an additional 4 weeks has elapsed.   Neurology was consulted for possible TIA.    SUBJECTIVE (INTERVAL HISTORY) His wife is at bedside. Pacemaker interrogated last evening no afib. He is improved. Takes ASA daily. Likely TIA     OBJECTIVE Vitals:   09/14/18 0435 09/14/18 0635 09/14/18 0650 09/14/18 0758  BP: 134/73 (!) 116/112 (!) 157/70 (!) 169/92  Pulse: (!) 56 (!) 56  (!) 55  Resp: 18 16    Temp: 98 F (36.7 C) 97.9 F (36.6 C)    TempSrc: Oral Oral    SpO2: 98% 97%  (!) 86%  Weight: 66.2 kg     Height:        CBC:  Recent Labs  Lab 09/13/18 1443 09/13/18 1505  WBC 8.0  --   NEUTROABS 4.3  --   HGB 14.6 14.6  HCT 44.2 43.0  MCV 88.2  --   PLT 210  --     Basic Metabolic Panel:  Recent Labs  Lab 09/13/18 1443 09/13/18 1505  NA 142 142  K 3.9 3.8  CL 108 106  CO2 25  --   GLUCOSE 94 90  BUN 13 15  CREATININE 1.16 1.10  CALCIUM 9.1  --      Lipid Panel:     Component Value Date/Time   CHOL 127 09/14/2018 0621   TRIG 42 09/14/2018 0621   HDL 57 09/14/2018 0621   CHOLHDL 2.2 09/14/2018 0621   VLDL 8 09/14/2018 0621   LDLCALC 62 09/14/2018 0621   HgbA1c: No results found for: HGBA1C Urine Drug Screen: No results found for: LABOPIA, COCAINSCRNUR, LABBENZ, AMPHETMU, THCU, LABBARB  Alcohol Level No results found for: ETH  IMAGING   Ct Angio Head W Or Wo Contrast Ct Angio Neck W Or Wo Contrast 09/14/2018 IMPRESSION:  1. No emergent vascular finding.  2. Atherosclerosis in the neck without flow limiting stenosis.  3. Severe right P2 segment stenosis.  4. Mild-to-moderate left M1 and moderate right M2 branch stenoses.  5. Chronic small vessel ischemia with age-indeterminate lacunar infarct in the right thalamus.    Ct Head Wo Contrast 09/13/2018 IMPRESSION:  No evidence of acute intracranial abnormality. Atrophy with small vessel ischemic changes. Old left basal ganglia lacunar infarct.     Transthoracic Echocardiogram - pending 00/00/00      PHYSICAL EXAM Blood pressure (!) 169/92, pulse (!) 55, temperature 97.9 F (  36.6 C), temperature source Oral, resp. rate 16, height 5\' 8"  (1.727 m), weight 66.2 kg, SpO2 (!) 86 %.  Physical exam: Exam: Gen: NAD, conversant, well nourised, obese, well groomed                     CV: RRR, no MRG. No Carotid Bruits. No peripheral edema, warm, nontender Eyes: Conjunctivae clear without exudates or hemorrhage  Neuro: Detailed Neurologic Exam  Speech:    Speech is normal; fluent and spontaneous with normal comprehension.  Cognition:    The patient is oriented to person, place, and time;     recent and remote memory slightly impaired;    Cranial Nerves:    The pupils are equal, round, and reactive to light.  Visual fields are full to finger confrontation. Extraocular movements are intact. Trigeminal sensation is intact and the muscles of mastication are normal. The  face is symmetric. The palate elevates in the midline. Hearing intact. Voice is normal. Shoulder shrug is normal. The tongue has normal motion without fasciculations.   Coordination:    No dysmetria   Motor Observation:    No asymmetry, no atrophy, and no involuntary movements noted. Tone:    Normal muscle tone    Strength:    Strength is V/V in the upper and lower limbs.      Sensation: intact to LT     Reflex Exam:  DTR's:    Deep tendon reflexes in the upper and lower extremities are symmetrical bilaterally.   Toes:    The toes are equiv bilaterally.   Clonus:    Clonus is absent.       ASSESSMENT/PLAN Mr. NIKOLAY DEMETRIOU is a 72 y.o. male with history of Alzheimer's dementia, previous stroke by imaging, CAD, cardiomyopathy, LBBB, hypertension and a permanet pacemaker [facility]) presenting with hypertension, dizziness with left sided weakness. He did not receive IV t-PA due to improvement in deficits.  Possible TIA:    Resultant  improved  CT head  - Nothing acute. Old left basal ganglia lacunar infarct.  MRI head - PPM   MRA head - PPM  CTA H&N - Severe right P2 segment stenosis.   Carotid Doppler - CTA neck performed or pending - carotid dopplers not indicated.  2D Echo - pending  LDL - 62  HgbA1c - pending  VTE prophylaxis - Lovenox  Diet -  - Heart healthy with thin liquids.  aspirin 81 mg daily prior to admission, now on aspirin 325 mg daily. Discharge on ASA 81mg  and Plavix 75mg  for 3 weeks then Plavix alone.  Patient counseled to be compliant with his antithrombotic medications  Ongoing aggressive stroke risk factor management  Therapy recommendations:  pending  Disposition:  Pending  Hypertension  Stable . Permissive hypertension (OK if < 220/120) but gradually normalize in 5-7 days . Long-term BP goal normotensive  Hyperlipidemia  Lipid lowering medication PTA: Zocor 10 mg daily  LDL 62, goal < 70  Current lipid  lowering medication: Lipitor 40 mg daily  Continue statin at discharge   Other Stroke Risk Factors  Advanced age  ETOH use, advised to drink no more than 1 alcoholic beverage per day.  Hx stroke/TIA - by imaging  CAD  Cardiomyopathy   Other Active Problems  Recent PPM for bradycardia -> interrogated at [facility] - no afib. per "Kathlee Nations"  Alzheimer's dementia  Severe right P2 segment stenosis - discharge on dapt for 3 weeks then Plavix alone (see above).  Cardiomyopathy - await echo results    Hospital day # 0   Personally examined patient and images, and have participated in and made any corrections needed to history, physical, neuro exam,assessment and plan as stated above.  I have personally obtained the history, evaluated lab date, reviewed imaging studies and agree with radiology interpretations.    Sarina Ill, MD Stroke Neurology    To contact Stroke Continuity provider, please refer to [email or web address] After hours, contact General Neurology

## 2018-09-14 NOTE — Evaluation (Signed)
Speech Language Pathology Evaluation Patient Details Name: Douglas Duffy MRN: [id number] DOB: [birth year] Today's Date: 09/14/2018 Time: 9924-2683 SLP Time Calculation (min) (ACUTE ONLY): 27 min  Problem List:  Patient Active Problem List   Diagnosis Date Noted  . TIA (transient ischemic attack) 09/13/2018  . Benign prostatic hyperplasia 09/13/2018  . 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 Depression 09/13/2018  . HTN (hypertension) 09/13/2018  . Scoliosis 09/13/2018  . Bradyarrhythmia 05/18/2015  . Multiple lacunar infarcts ([place]) 04/20/2014  . CAD (coronary artery disease) 08/13/2013  . Cardiomyopathy ([place]) 08/13/2013  . Hyperlipidemia 08/13/2013  . LBBB (left bundle branch block) 08/13/2013  . Abnormal EKG 06/17/2013   Past Medical History:  Past Medical History:  Diagnosis Date  . Hypertension   . Pacemaker 08/13/2018   Past Surgical History: History reviewed. No pertinent surgical history. HPI:  Pt is a 72 y.o. male who presented to the ED after an episode of dizziness as well as numbness and inability to move his LLE. He has a PMH significant for HTN, CAD,  bradyarrhythmia s/p pacemaker, h/o first degree heart block, cardiomyopathy, scoliosis, Alzheimer's disease, chronic back pain, BPH. He had a pacemaker placed 4 weeks ago. CT negative for acute abnormalities   Assessment / Plan / Recommendation Clinical Impression  Patient awake and alert and able to communicate at baseline abilities according to wife (at bedside). No speech errors present, Cognition and language are [facility]. No speech therapy needs at this time. Speech is signing off.     SLP Assessment  SLP Recommendation/Assessment: Patient does not need any further Speech Lanaguage Pathology Services SLP Visit Diagnosis: Cognitive communication deficit (R41.841)    Follow Up Recommendations    no tx needed   Frequency and Duration           SLP Evaluation Cognition  Overall Cognitive Status: History of cognitive  impairments - at baseline Arousal/Alertness: Awake/alert Orientation Level: Oriented to person;Oriented to place;Oriented to situation Attention: Focused Focused Attention: Appears intact Memory: Appears intact Awareness: Appears intact Problem Solving: Appears intact Executive Function: Reasoning Reasoning: Appears intact Safety/Judgment: Appears intact       Comprehension  Auditory Comprehension Overall Auditory Comprehension: Appears within functional limits for tasks assessed Yes/No Questions: Within Functional Limits Commands: Within Functional Limits Conversation: Complex Visual Recognition/Discrimination Discrimination: Within Function Limits Reading Comprehension Reading Status: Within funtional limits    Expression Expression Primary Mode of Expression: Verbal Verbal Expression Overall Verbal Expression: Appears within functional limits for tasks assessed Initiation: No impairment Repetition: No impairment Naming: No impairment Pragmatics: No impairment Written Expression Dominant Hand: Right Written Expression: Within Functional Limits   Oral / Motor  Oral Motor/Sensory Function Overall Oral Motor/Sensory Function: Within functional limits Motor Speech Overall Motor Speech: Appears within functional limits for tasks assessed Respiration: Within functional limits Phonation: Normal Resonance: Within functional limits Intelligibility: Intelligible Motor Planning: Witnin functional limits Motor Speech Errors: Not applicable   GO                    Charlynne Cousins Esthela Brandner, MA, CCC-SLP 09/14/2018 4:23 PM

## 2018-09-14 NOTE — Progress Notes (Addendum)
[employer] Rep paged. Awaiting callback.   1125: Spoke with [employer] Rep Clarise Cruz. Pacemaker was interrogated in ER on 09/13/18 at 1900. No afib, x2 non sustained VT, x4 episodes of PAC's since August 25, 2018. Rep stated she did not need to come and interrogate pacer again. MD notified via [place].

## 2018-09-14 NOTE — Progress Notes (Signed)
  Echocardiogram 2D Echocardiogram has been performed.  Johny Chess 09/14/2018, 8:45 AM

## 2018-09-14 NOTE — Discharge Instructions (Signed)
You were admitted to the hospital after having an episode of leg weakness that resolved.  This is believed to be from a transient ischemic attack or TIA.  We took some imaging to make sure that you did not have a large event such as a stroke.  Neurology saw you in the hospital and recommended that you follow-up with your neurologist in [place]. You may need an MRI of your brain in 4 weeks after you have had your pacemaker for 8 weeks.  They also recommend that you start Plavix 75 mg daily along with your aspirin 81 mg daily for 3 weeks.  After that 3 weeks you should take your Plavix 75 mg daily alone and stop your aspirin.  We have also started you on Lipitor 40 mg.  This is for your cholesterol and to help prevent any further events.  Please stop taking your Zocor.  For your medical records, these will likely be accessible through the computer at your doctor in [place].  If they have trouble seeing any of the testing done here then please have them send a fax requesting medical records.  If you develop any facial droop, change in vision, weakness of one part of your body then please do not hesitate to come back to the ED.

## 2018-09-14 NOTE — Progress Notes (Addendum)
Spoke with neurologist PA Mikey Bussing over the phone who recommended that patient go home on Plavix 75 mg and aspirin 81 for 3 weeks and then continue Plavix alone.  Patient has remained stable and has no PT or OT follow-up.  We will continue with discharging patient.  Patient will need follow-up with neurology once he is back home in [place].  Douglas Antania Hoefling, DO PGY-2, [place] [facility]

## 2018-09-14 NOTE — Evaluation (Signed)
Occupational Therapy Evaluation and Discharge Patient Details Name: Douglas Duffy MRN: [id number] DOB: 1946/06/05 Today's Date: 09/14/2018    History of Present Illness Pt is a 72 y.o. male who presented to the ED after an episode of dizziness as well as numbness and inability to move his LLE. He has a PMH significant for HTN, CAD,  bradyarrhythmia s/p pacemaker, h/o first degree heart block, cardiomyopathy, scoliosis, Alzheimer's disease, chronic back pain, BPH. He had a pacemaker placed 4 weeks ago. CT negative for acute abnormalities.   Clinical Impression   Patient evaluated by Occupational Therapy with no further acute OT needs identified. Pt with history of cognitive impairments at baseline and wife reports that he is functioning at cognitive baseline at this time. He is moving more slowly than usual per wife likely due to being in an unfamiliar environment. However, pt is able to complete all basic ADL tasks independently at this time. All education has been completed and the patient has no further questions. See below for any follow-up Occupational Therapy or equipment needs. OT to sign off. Thank you for referral.      Follow Up Recommendations  No OT follow up;Supervision - Intermittent    Equipment Recommendations  None recommended by OT    Recommendations for Other Services       Precautions / Restrictions Precautions Precautions: Fall Precaution Comments: History of Alzhiemer's disease.  Restrictions Weight Bearing Restrictions: No      Mobility Bed Mobility Overal bed mobility: Independent                Transfers Overall transfer level: Independent                    Balance Overall balance assessment: Mild deficits observed, not formally tested                                         ADL either performed or assessed with clinical judgement   ADL Overall ADL's : Independent                                        General ADL Comments: Able to complete basic ADL tasks independently in hospital setting. Wife reports that he is moving a bit slower than usual but likely due to being in a new environment.      Vision Patient Visual Report: No change from baseline Vision Assessment?: No apparent visual deficits     Perception     Praxis      Pertinent Vitals/Pain       Hand Dominance     Extremity/Trunk Assessment Upper Extremity Assessment Upper Extremity Assessment: Overall WFL for tasks assessed   Lower Extremity Assessment Lower Extremity Assessment: Overall WFL for tasks assessed       Communication Communication Communication: No difficulties   Cognition Arousal/Alertness: Awake/alert Behavior During Therapy: WFL for tasks assessed/performed Overall Cognitive Status: History of cognitive impairments - at baseline                                 General Comments: Pt with history of Alzheimer's disease at baseline. He is oriented to his location, the situation as well as his birthdate today.    General  Comments  Wife present and engaged in beginning of session.     Exercises     Shoulder Instructions      Home Living Family/patient expects to be discharged to:: Private residence Living Arrangements: Spouse/significant other Available Help at Discharge: Family;Available 24 hours/day Type of Home: Apartment Home Access: Stairs to enter Entrance Stairs-Number of Steps: 10 Entrance Stairs-Rails: Right;Left(cannot reach both) Home Layout: One level     Bathroom Shower/Tub: Teacher, early years/pre: Standard     Home Equipment: None   Additional Comments: Pt and wife are here visiting from [place].       Prior Functioning/Environment Level of Independence: Independent  Gait / Transfers Assistance Needed: Independent with mobility ADL's / Homemaking Assistance Needed: Wife assists with IADL tasks but pt able to do all basic ADL  and some cleaning tasks without assistance   Comments: Wife supervises due to cognitive deficits. Pt is very active at baseline.         OT Problem List: Impaired balance (sitting and/or standing);Decreased cognition      OT Treatment/Interventions:      OT Goals(Current goals can be found in the care plan section) Acute Rehab OT Goals Patient Stated Goal: to go home OT Goal Formulation: With patient/family  OT Frequency:     Barriers to D/C:            Co-evaluation              AM-PAC PT "6 Clicks" Daily Activity     Outcome Measure Help from another person eating meals?: None Help from another person taking care of personal grooming?: None Help from another person toileting, which includes using toliet, bedpan, or urinal?: None Help from another person bathing (including washing, rinsing, drying)?: None Help from another person to put on and taking off regular upper body clothing?: None Help from another person to put on and taking off regular lower body clothing?: None 6 Click Score: 24   End of Session    Activity Tolerance: Patient tolerated treatment well Patient left: Other (comment)(ambulating in rehab gym with PT)  OT Visit Diagnosis: Other abnormalities of gait and mobility (R26.89)                Time: 1165-7903 OT Time Calculation (min): 18 min Charges:  OT General Charges $OT Visit: 1 Visit OT Evaluation $OT Eval Low Complexity: [place] Pager [phone] Office [phone]   [facility] A Nimrat Woolworth 09/14/2018, 11:19 AM

## 2018-09-14 NOTE — Discharge Summary (Signed)
[facility] Discharge Summary  Patient name: Douglas Duffy Medical record number: [id number] Date of birth: 06/08/1946 Age: 72 y.o. Gender: male Date of Admission: 09/13/2018  Date of Discharge: 09/14/18  Admitting Physician: Lind Covert, MD  Primary Care Provider: Patient, No Pcp Per Consultants: Neurology  Indication for Hospitalization: Left leg weakness and dizziness  Discharge Diagnoses/Problem List:  TIA Hypertension CAD Bradycardia arrhythmia status post pacemaker History of first-degree heart block Alzheimer's disease Scoliosis  Disposition: Home   Discharge Condition: Stable  Discharge Exam:  General: Pleasantly demented resting in bed Cardiovascular: Regular rate and rhythm no murmurs or gallops Respiratory: Clear to auscultation bilaterally normal work of breathing Abdomen: Soft nontender nondistended Extremities: No lower extremity edema PE per Dr. Grandville Silos on day of discharge  Brief Hospital Course:  Douglas Duffy is a 72 year old male who presented to the ED with left leg weakness and dizziness.  Patient reported that he was sitting in a chair when his left leg felt numb.  After attempting to stand and walk his left leg weakness resolved within 10 minutes.  He came into the ED because he has history of TIA.  CT head without contrast did not show any acute hemorrhage but old infarct.  Patient had recent placement of pacemaker 4 weeks ago which would not allow for MRI imaging.  Pacemaker had to be 54 weeks old in order for MRI to be obtained.  Patient had CTA head and neck obtained per neuro recommendations.  CTA showed right P2 severe stenosis.  Neuro recommended that patient be started on DAPT therapy with Plavix 75 mg daily and aspirin 81 mg daily for 3 weeks and then patient is to continue Plavix.  You are also recommended starting high intensity statin and patient was to discontinue his Zocor and start Lipitor 40 mg  daily.  PT/OT/SLP recommended no further follow-up for patient.  While in hospital patient remained stable and no focal neurological deficits were noted.  Patient instructed to follow-up with neurologist in [place] in the next couple of weeks.  May want further MRI imaging after pacemaker has been in place for 8 weeks.  Issues for Follow Up:  1. Patient to take DAPT therapy for 3 weeks with Plavix and aspirin and then Plavix alone. 2. Patient to follow-up with neurologist in [place]. 3. Patient may require further MRI imaging of brain in 4 weeks after pacemaker has been placed for 8 weeks. 4. Patient switched from Zocor 10 mg daily to Lipitor 40 mg daily.  Significant Procedures: None  Significant Labs and Imaging:  Recent Labs  Lab 09/13/18 1443 09/13/18 1505  WBC 8.0  --   HGB 14.6 14.6  HCT 44.2 43.0  PLT 210  --    Recent Labs  Lab 09/13/18 1443 09/13/18 1505  NA 142 142  K 3.9 3.8  CL 108 106  CO2 25  --   GLUCOSE 94 90  BUN 13 15  CREATININE 1.16 1.10  CALCIUM 9.1  --   ALKPHOS 50  --   AST 30  --   ALT 20  --   ALBUMIN 3.7  --    Lipid Panel     Component Value Date/Time   CHOL 127 09/14/2018 0621   TRIG 42 09/14/2018 0621   HDL 57 09/14/2018 0621   CHOLHDL 2.2 09/14/2018 0621   VLDL 8 09/14/2018 0621   LDLCALC 62 09/14/2018 0621    ECHO: Study Conclusions - Left ventricle: The  cavity size was normal. There was moderate   concentric hypertrophy. Systolic function was mildly reduced. The   estimated ejection fraction was in the range of 45% to 50%.   Diffuse hypokinesis. Doppler parameters are consistent with   abnormal left ventricular relaxation (grade 1 diastolic   dysfunction). - Regional wall motion abnormality: Mild hypokinesis of the   basal-mid inferior myocardium. - Ventricular septum: Septal motion showed abnormal function and   dyssynergy. These changes are consistent with right ventricular   pacing. - Right ventricle: Pacer wire or  catheter noted in right ventricle. - Right atrium: Pacer wire or catheter noted in right atrium. - Tricuspid valve: There was mild regurgitation. - Pulmonic valve: There was mild regurgitation.   Ct Angio Head W Or Wo Contrast  Result Date: 09/14/2018 CLINICAL DATA:  TIA workup. Episode of dizziness and left lower leg numbness and weakness yesterday EXAM: CT ANGIOGRAPHY HEAD AND NECK TECHNIQUE: Multidetector CT imaging of the head and neck was performed using the standard protocol during bolus administration of intravenous contrast. Multiplanar CT image reconstructions and MIPs were obtained to evaluate the vascular anatomy. Carotid stenosis measurements (when applicable) are obtained utilizing NASCET criteria, using the distal internal carotid diameter as the denominator. CONTRAST:  40mL ISOVUE-370 IOPAMIDOL (ISOVUE-370) INJECTION 76% COMPARISON:  Head CT from yesterday FINDINGS: CTA NECK FINDINGS Aortic arch: Atheromatous wall thickening.  No acute finding. Right carotid system: Diffuse atheromatous wall thickening. There is calcified plaque at the ICA bulb without flow limiting stenosis or ulceration. Negative for beading or dissection. Left carotid system: Diffuse low-density atheromatous wall thickening of the distal common carotid. There is moderate partially calcified plaque at the bifurcation and proximal ICA. No focal flow limiting stenosis. Left ICA is smaller than the right in the setting of large right posterior communicating artery and left A1 hypoplasia. Negative for beading. Vertebral arteries: Proximal subclavian atherosclerosis without flow limiting stenosis. The vertebrals are smooth and diffusely patent Skeleton: No acute or aggressive finding. Other neck: Chronic bilateral under pneumatized mastoids. Upper chest: Negative Review of the MIP images confirms the above findings CTA HEAD FINDINGS Anterior circulation: Atherosclerotic plaque on the carotid siphons. No branch occlusion or  reversible flow limiting stenosis. There is mild to moderate left distal M1 and moderate right proximal M2 stenoses. History of leg symptoms with no focal proximal ACA stenosis. Negative for aneurysm Posterior circulation: Mild left vertebral artery dominance. The vertebral and basilar arteries are smooth and widely patent. Calcified plaque is seen at the left V4 segment. There is atherosclerotic irregularity of bilateral posterior cerebral arteries with severe narrowing of the right P2 segment. The right PCA is fetal type. Negative for aneurysm Venous sinuses: Negative Anatomic variants: As above Delayed phase: No abnormal intracranial enhancement. 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 Review of the MIP images confirms the above findings IMPRESSION: 1. No emergent vascular finding. 2. Atherosclerosis in the neck without flow limiting stenosis. 3. Severe right P2 segment stenosis. 4. Mild-to-moderate left M1 and moderate right M2 branch stenoses. 5. Chronic small vessel ischemia with age-indeterminate lacunar infarct in the right thalamus. Electronically Signed   By: Monte Fantasia M.D.   On: 09/14/2018 09:28   Ct Angio Neck W Or Wo Contrast  Result Date: 09/14/2018 CLINICAL DATA:  TIA workup. Episode of dizziness and left lower leg numbness and weakness yesterday EXAM: CT ANGIOGRAPHY HEAD AND NECK TECHNIQUE: Multidetector CT imaging of the head and neck was performed using the standard protocol during bolus  administration of intravenous contrast. Multiplanar CT image reconstructions and MIPs were obtained to evaluate the vascular anatomy. Carotid stenosis measurements (when applicable) are obtained utilizing NASCET criteria, using the distal internal carotid diameter as the denominator. CONTRAST:  26mL ISOVUE-370 IOPAMIDOL (ISOVUE-370) INJECTION 76% COMPARISON:  Head CT from yesterday FINDINGS: CTA NECK FINDINGS Aortic arch: Atheromatous wall thickening.  No acute finding. Right  carotid system: Diffuse atheromatous wall thickening. There is calcified plaque at the ICA bulb without flow limiting stenosis or ulceration. Negative for beading or dissection. Left carotid system: Diffuse low-density atheromatous wall thickening of the distal common carotid. There is moderate partially calcified plaque at the bifurcation and proximal ICA. No focal flow limiting stenosis. Left ICA is smaller than the right in the setting of large right posterior communicating artery and left A1 hypoplasia. Negative for beading. Vertebral arteries: Proximal subclavian atherosclerosis without flow limiting stenosis. The vertebrals are smooth and diffusely patent Skeleton: No acute or aggressive finding. Other neck: Chronic bilateral under pneumatized mastoids. Upper chest: Negative Review of the MIP images confirms the above findings CTA HEAD FINDINGS Anterior circulation: Atherosclerotic plaque on the carotid siphons. No branch occlusion or reversible flow limiting stenosis. There is mild to moderate left distal M1 and moderate right proximal M2 stenoses. History of leg symptoms with no focal proximal ACA stenosis. Negative for aneurysm Posterior circulation: Mild left vertebral artery dominance. The vertebral and basilar arteries are smooth and widely patent. Calcified plaque is seen at the left V4 segment. There is atherosclerotic irregularity of bilateral posterior cerebral arteries with severe narrowing of the right P2 segment. The right PCA is fetal type. Negative for aneurysm Venous sinuses: Negative Anatomic variants: As above Delayed phase: No abnormal intracranial enhancement. 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 Review of the MIP images confirms the above findings IMPRESSION: 1. No emergent vascular finding. 2. Atherosclerosis in the neck without flow limiting stenosis. 3. Severe right P2 segment stenosis. 4. Mild-to-moderate left M1 and moderate right M2 branch stenoses. 5.  Chronic small vessel ischemia with age-indeterminate lacunar infarct in the right thalamus. Electronically Signed   By: Monte Fantasia M.D.   On: 09/14/2018 09:28    Results/Tests Pending at Time of Discharge: Hemoglobin A1c  Discharge Medications:  Allergies as of 09/14/2018      Reactions   Lobster [shellfish Allergy] Itching   Ace Inhibitors Cough   Currently taking losartan      Medication List    STOP taking these medications   simvastatin 10 MG tablet Commonly known as:  ZOCOR     TAKE these medications   aspirin EC 81 MG tablet Take 81 mg by mouth daily.   atorvastatin 40 MG tablet Commonly known as:  LIPITOR Take 1 tablet (40 mg total) by mouth daily at 6 PM.   clopidogrel 75 MG tablet Commonly known as:  PLAVIX Take 1 tablet (75 mg total) by mouth daily.   Fish Oil 1000 MG Caps Take 1,000 mg by mouth daily.   losartan 50 MG tablet Commonly known as:  COZAAR Take 50 mg by mouth daily.   memantine 5 MG tablet Commonly known as:  NAMENDA Take 5 mg by mouth 2 (two) times daily.   TURMERIC PO Take 1 tablet by mouth daily.   Vitamin D 2000 units tablet Take 2,000 Units by mouth daily.       Discharge Instructions: Please refer to Patient Instructions section of EMR for full details.  Patient was counseled important signs  and symptoms that should prompt return to medical care, changes in medications, dietary instructions, activity restrictions, and follow up appointments.   Follow-Up Appointments:   Annisten Manchester, [place], DO 09/14/2018, 5:36 PM PGY-2, [place]

## 2018-09-15 LAB — HEMOGLOBIN A1C
HEMOGLOBIN A1C: 5.2 % (ref 4.8–5.6)
MEAN PLASMA GLUCOSE: 103 mg/dL

## 2018-09-16 ENCOUNTER — Emergency Department (HOSPITAL_COMMUNITY): Payer: Medicare Other

## 2018-09-16 ENCOUNTER — Emergency Department (HOSPITAL_COMMUNITY)
Admission: EM | Admit: 2018-09-16 | Discharge: 2018-09-16 | Disposition: A | Discharge status: Home / Self Care | Payer: Medicare Other | Attending: Emergency Medicine | Admitting: Emergency Medicine

## 2018-09-16 DIAGNOSIS — Z7902 Long term (current) use of antithrombotics/antiplatelets: Secondary | ICD-10-CM | POA: Diagnosis not present

## 2018-09-16 DIAGNOSIS — R42 Dizziness and giddiness: Secondary | ICD-10-CM | POA: Diagnosis present

## 2018-09-16 DIAGNOSIS — I2510 Atherosclerotic heart disease of native coronary artery without angina pectoris: Secondary | ICD-10-CM | POA: Insufficient documentation

## 2018-09-16 DIAGNOSIS — I10 Essential (primary) hypertension: Secondary | ICD-10-CM

## 2018-09-16 DIAGNOSIS — Z950 Presence of cardiac pacemaker: Secondary | ICD-10-CM | POA: Diagnosis not present

## 2018-09-16 DIAGNOSIS — G459 Transient cerebral ischemic attack, unspecified: Secondary | ICD-10-CM

## 2018-09-16 LAB — COMPREHENSIVE METABOLIC PANEL
ALBUMIN: 3.6 g/dL (ref 3.5–5.0)
ALT: 21 U/L (ref 0–44)
AST: 33 U/L (ref 15–41)
Alkaline Phosphatase: 42 U/L (ref 38–126)
Anion gap: 10 (ref 5–15)
BUN: 15 mg/dL (ref 8–23)
CHLORIDE: 109 mmol/L (ref 98–111)
CO2: 22 mmol/L (ref 22–32)
Calcium: 9.2 mg/dL (ref 8.9–10.3)
Creatinine, Ser: 1.11 mg/dL (ref 0.61–1.24)
GFR calc non Af Amer: >60 mL/min (ref >60)
GLUCOSE: 114 mg/dL — AB (ref 70–99)
Potassium: 4.2 mmol/L (ref 3.5–5.1)
SODIUM: 141 mmol/L (ref 135–145)
Total Bilirubin: 0.7 mg/dL (ref 0.3–1.2)
Total Protein: 6.3 g/dL — ABNORMAL LOW (ref 6.5–8.1)

## 2018-09-16 LAB — CBC
HCT: 44.4 % (ref 39.0–52.0)
HEMOGLOBIN: 14.9 g/dL (ref 13.0–17.0)
MCH: 29.8 pg (ref 26.0–34.0)
MCHC: 33.6 g/dL (ref 30.0–36.0)
MCV: 88.8 fL (ref 78.0–100.0)
PLATELETS: 198 10*3/uL (ref 150–400)
RBC: 5 MIL/uL (ref 4.22–5.81)
RDW: 12.5 % (ref 11.5–15.5)
WBC: 8.5 10*3/uL (ref 4.0–10.5)

## 2018-09-16 LAB — URINALYSIS, ROUTINE W REFLEX MICROSCOPIC
Bilirubin Urine: NEGATIVE
Glucose, UA: NEGATIVE mg/dL
Hgb urine dipstick: NEGATIVE
Ketones, ur: NEGATIVE mg/dL
LEUKOCYTES UA: NEGATIVE
Nitrite: NEGATIVE
PROTEIN: NEGATIVE mg/dL
SPECIFIC GRAVITY, URINE: 1.017 (ref 1.005–1.030)
pH: 6 (ref 5.0–8.0)

## 2018-09-16 LAB — PROTIME-INR
INR: 1.1
Prothrombin Time: 14.1 seconds (ref 11.4–15.2)

## 2018-09-16 MED ORDER — HYDRALAZINE HCL 20 MG/ML IJ SOLN
5.0000 mg | Freq: Once | INTRAMUSCULAR | Status: AC
  Administered 2018-09-16: 5 mg via INTRAVENOUS
  Filled 2018-09-16: qty 1

## 2018-09-16 MED ORDER — ASPIRIN 81 MG PO CHEW
81.0000 mg | CHEWABLE_TABLET | Freq: Once | ORAL | Status: AC
  Administered 2018-09-16: 81 mg via ORAL
  Filled 2018-09-16: qty 1

## 2018-09-16 MED ORDER — CLOPIDOGREL BISULFATE 75 MG PO TABS
225.0000 mg | ORAL_TABLET | Freq: Once | ORAL | Status: AC
  Administered 2018-09-16: 225 mg via ORAL
  Filled 2018-09-16: qty 3

## 2018-09-16 NOTE — Consult Note (Addendum)
Neurology Consultation  Reason for Consult: Dysarthria and left leg weakness which was transitory Referring Physician: Dr. Jeanell Sparrow  History is obtained from: Wife and patient  HPI: Douglas Duffy is a 72 y.o. male hypertension, recent placement of pacemaker, recently hospitalized for left leg weakness with full stroke work-up which was negative at that time.  Likely however not documented neurodegenerative decline.  Patient was just discharged from the hospital on 9/14.  At that time stroke work-up was negative with the exception of CT angiogram showing atherosclerosis in the neck without flow-limiting stenosis, severe right P2 segment stenosis, mild to moderate left M1 and M2 branch stenosis.  He also had age-indeterminate lacunar infarcts in the right thalamus.  Patient was discharged home with increased Atrovent statin of 40 mg a day along with adding Plavix to his aspirin daily.  Patient did not receive Plavix on the day of discharge in addition the next day he did not receive Plavix secondary to being unable to obtain Plavix.  He had his first dose of Plavix today.  She states and he states today, he gone for a walk and upon arriving home he sat down and noted that he had dysarthria along with left leg weakness.  At some point he was slumped over to the left.  This lasted approximately 10 minutes.  At this point his symptoms have all resolved.  It should be noted that the wife has some confusion about the medications and when they should be given.  Apparently he was supposed to be on hydrochlorothiazide however she took him off of this due to the fact she thought his blood pressure was too low.   LKW: 10 AM on 09/16/2018 tpa given?: no, symptoms resolved Premorbid modified Rankin scale (mRS): 0  NIH stroke score of 0   ROS: A 14 point ROS was performed and is negative except as noted in the HPI.    Past Medical History:  Diagnosis Date  . Hypertension   . Pacemaker 08/13/2018    Family  history; Hypertension both mother and father  Social History:   reports that he has never smoked. He has never used smokeless tobacco. He reports that he drinks alcohol. He reports that he does not use drugs.  Medications  Current Facility-Administered Medications:  .  aspirin chewable tablet 81 mg, 81 mg, Oral, Once, Geiple, Joshua, PA-C .  clopidogrel (PLAVIX) tablet 225 mg, 225 mg, Oral, Once, Carlisle Cater, PA-C  Current Outpatient Medications:  .  aspirin EC 81 MG tablet, Take 81 mg by mouth daily., Disp: , Rfl:  .  atorvastatin (LIPITOR) 40 MG tablet, Take 1 tablet (40 mg total) by mouth daily at 6 PM., Disp: 30 tablet, Rfl: 0 .  Cholecalciferol (VITAMIN D) 2000 units tablet, Take 2,000 Units by mouth daily., Disp: , Rfl:  .  clopidogrel (PLAVIX) 75 MG tablet, Take 1 tablet (75 mg total) by mouth daily., Disp: 30 tablet, Rfl: 0 .  losartan (COZAAR) 50 MG tablet, Take 50 mg by mouth daily., Disp: , Rfl: 0 .  memantine (NAMENDA) 5 MG tablet, Take 5 mg by mouth 2 (two) times daily., Disp: , Rfl: 0 .  Omega-3 Fatty Acids (FISH OIL) 1000 MG CAPS, Take 1,000 mg by mouth daily., Disp: , Rfl:  .  TURMERIC PO, Take 1 tablet by mouth daily., Disp: , Rfl:    Exam: Current vital signs: BP (!) 173/77   Pulse (!) 51   Temp 97.8 F (36.6 C) (Oral)  Resp 13   SpO2 98%  Vital signs in last 24 hours: Temp:  [97.8 F (36.6 C)] 97.8 F (36.6 C) (09/17 1706) Pulse Rate:  [50-58] 51 (09/17 1645) Resp:  [11-21] 13 (09/17 1645) BP: (156-173)/(65-84) 173/77 (09/17 1645) SpO2:  [98 %-100 %] 98 % (09/17 1645)  GENERAL: Awake, alert in NAD HEENT: - Normocephalic and atraumatic, dry mm, no LN++, no Thyromegally Ext: warm, well perfused, intact peripheral pulses,   NEURO:  Mental Status: To hospital, month, wife but not year., speech is clear.  Naming, repetition, fluency, and comprehension intact. Cranial Nerves: PERRL 2 mm/brisk. EOMI, visual fields full, no facial asymmetry facial  sensation intact, hearing intact, tongue/uvula/soft palate midline, Motor: 5/5 throughout Tone: is normal and bulk is normal Sensation- Intact to light touch bilaterally Coordination: FTN intact bilaterally, no ataxia in BLE.     Labs I have reviewed labs in epic and the results pertinent to this consultation are:   CBC    Component Value Date/Time   WBC 8.5 09/16/2018 1448   RBC 5.00 09/16/2018 1448   HGB 14.9 09/16/2018 1448   HCT 44.4 09/16/2018 1448   PLT 198 09/16/2018 1448   MCV 88.8 09/16/2018 1448   MCH 29.8 09/16/2018 1448   MCHC 33.6 09/16/2018 1448   RDW 12.5 09/16/2018 1448   LYMPHSABS 2.7 09/13/2018 1443   MONOABS 0.7 09/13/2018 1443   EOSABS 0.2 09/13/2018 1443   BASOSABS 0.0 09/13/2018 1443    CMP     Component Value Date/Time   NA 141 09/16/2018 1448   K 4.2 09/16/2018 1448   CL 109 09/16/2018 1448   CO2 22 09/16/2018 1448   GLUCOSE 114 (H) 09/16/2018 1448   BUN 15 09/16/2018 1448   CREATININE 1.11 09/16/2018 1448   CALCIUM 9.2 09/16/2018 1448   PROT 6.3 (L) 09/16/2018 1448   ALBUMIN 3.6 09/16/2018 1448   AST 33 09/16/2018 1448   ALT 21 09/16/2018 1448   ALKPHOS 42 09/16/2018 1448   BILITOT 0.7 09/16/2018 1448   GFRNONAA >60 09/16/2018 1448   GFRAA >60 09/16/2018 1448    Lipid Panel     Component Value Date/Time   CHOL 127 09/14/2018 0621   TRIG 42 09/14/2018 0621   HDL 57 09/14/2018 0621   CHOLHDL 2.2 09/14/2018 0621   VLDL 8 09/14/2018 0621   LDLCALC 62 09/14/2018 0621     Imaging I have reviewed the images obtained:  CT-scan of the brain--- pending  Unable to obtain MRI secondary to pacemaker   [place] Performed a face to face diagnostic evaluation.  I have reviewed the contents of history and physical exam as documented by PA/ARNP/Resident and agree with above documentation.  I have discussed and formulated the  plan as documented below. Edits to the note have been made as needed.     Impression: TIA.  Patient is back to his baseline on examination with no focal neurological deficits.   ASSESSMENT AND PLAN  72 y.o. male with history of Alzheimer's dementia, previous stroke by imaging, CAD, cardiomyopathy, LBBB, hypertension and a permanet pacemaker [facility]) [facility] for a TIA presenting with transient left-sided weakness for about 15 to 20 minutes after going for work.  He completed stroke work-up including CT angiogram which showed multifocal intracranial stenosis most prominently noted over right P2 as well as moderate right MCA stenosis.  No flow limiting stenosis in the carotids.  Patient  cannot undergo MRI for another 4 weeks due to recently placed permanent pacemaker.  He was s discharged on aspirin and Plavix, however received Plavix only this morning as his wife just picked up the prescription yesterday night.  He has not been on any aspirin.  Recommendations: - Do not feel he needs to be admitted again for TIA work-up as this was completed it 2 days ago.  - Patient received first dose of Plavix only this morning, likely not to be therapeutic  - Recommend loading with 225 mg Plavix now along with 81 mg of aspirin now - Starting tomorrow 75 mg of Plavix daily along with 81 mg of aspirin daily - Continue statin 40 mg daily -Counseled on stroke symptoms and need to call 911 if symptoms reoccur. - We will need follow-up with both primary care physician and neurologist when returning to [place].     Karena Addison Aroor MD [facility] [phone]   If 7pm to 7am, please call on call as listed on AMION.

## 2018-09-16 NOTE — ED Triage Notes (Signed)
Sudden onset left-sided hemiparesis and slurred speech - lasted approx 5-10 min per wife; here on 9/14 - dx'd with TIA - placed on Plavix; presently awake, alert, no neuro symptoms at present

## 2018-09-16 NOTE — ED Notes (Signed)
Discharge instructions provided.  Patient reports he hasn't taken his bedtime meds for bp.  Will take when he gets home.

## 2018-09-16 NOTE — Discharge Instructions (Signed)
Please read and follow all provided instructions.  Your diagnoses today include:  1. TIA (transient ischemic attack)     Tests performed today include:  CT head - no change from previous  Blood counts and electrolytes  Urine test  Vital signs. See below for your results today.   Medications prescribed:   None  Take any prescribed medications only as directed.  Home care instructions:  Follow any educational materials contained in this packet.  Please make sure that you are taking your:  Clopidogrel 75mg  once a day  AND  Aspirin 81mg  once a day.   Follow-up instructions: Please follow-up with your doctors as soon as possible for further evaluation of stroke symptoms and blood pressure.   Return instructions:   Please return to the Emergency Department if you experience worsening symptoms.   Please return if you have any other emergent concerns.  Additional Information:  Your vital signs today were: BP (!) 174/89    Pulse (!) 57    Temp 97.8 F (36.6 C) (Oral)    Resp 20    SpO2 99%  If your blood pressure (BP) was elevated above 135/85 this visit, please have this repeated by your doctor within one month. --------------

## 2018-09-16 NOTE — ED Provider Notes (Signed)
[facility] EMERGENCY DEPARTMENT Provider Note   CSN: [id number] Arrival date & time: 09/16/18  1435     History   Chief Complaint Chief Complaint  Patient presents with  . Fatigue    HPI Douglas Duffy is a 72 y.o. male.  Patient with history of TIA on Plavix, hypertension, pacemaker, from [place] visiting the area until October 1 -- presents the emergency department today with complaints of dizziness, left arm and leg weakness, and severe slurred speech.  Patient states that he was out walking and began having some generalized dizziness at about 12 PM today.  He went and ate lunch with his wife and approximately 1:50pm he developed near paralysis of his left arm and leg, severe slurred speech to the point where his wife could not understand him, and slouching to the left side.  Patient notes that he was frustrated because he could not communicate and also his leg would not move.  Patient's wife called her sister who is a [employer] scientist and then immediately called 911.  Patient's symptoms are gradually starting to improve upon EMS arrival with full resolution after 15 to 20 minutes.  Patient is back to his baseline at time of arrival here.     Past Medical History:  Diagnosis Date  . Hypertension   . Pacemaker 08/13/2018    Patient Active Problem List   Diagnosis Date Noted  . TIA (transient ischemic attack) 09/13/2018  . Benign prostatic hyperplasia 09/13/2018  . 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 Depression 09/13/2018  . HTN (hypertension) 09/13/2018  . Scoliosis 09/13/2018  . Bradyarrhythmia 05/18/2015  . Multiple lacunar infarcts ([place]) 04/20/2014  . CAD (coronary artery disease) 08/13/2013  . Cardiomyopathy ([place]) 08/13/2013  . Hyperlipidemia 08/13/2013  . LBBB (left bundle branch block) 08/13/2013  . Abnormal EKG 06/17/2013    No past surgical history on file.      Home Medications    Prior to Admission medications   Medication Sig Start Date End Date  Taking? Authorizing Provider  aspirin EC 81 MG tablet Take 81 mg by mouth daily.    [provider]  atorvastatin (LIPITOR) 40 MG tablet Take 1 tablet (40 mg total) by mouth daily at 6 PM. 09/14/18   Shirley, [place], DO  Cholecalciferol (VITAMIN D) 2000 units tablet Take 2,000 Units by mouth daily.    [provider]  clopidogrel (PLAVIX) 75 MG tablet Take 1 tablet (75 mg total) by mouth daily. 09/14/18 09/14/19  Shirley, [place], DO  losartan (COZAAR) 50 MG tablet Take 50 mg by mouth daily. 07/10/18   [provider]  memantine (NAMENDA) 5 MG tablet Take 5 mg by mouth 2 (two) times daily. 07/19/18   [provider]  Omega-3 Fatty Acids (FISH OIL) 1000 MG CAPS Take 1,000 mg by mouth daily.    [provider]  TURMERIC PO Take 1 tablet by mouth daily.    [provider]    Family History No family history on file.  Social History Social History   Tobacco Use  . Smoking status: Never Smoker  . Smokeless tobacco: Never Used  Substance Use Topics  . Alcohol use: Yes    Frequency: Never    Comment: rare  . Drug use: Never     Allergies   Lobster [shellfish allergy] and Ace inhibitors   Review of Systems Review of Systems  Constitutional: Negative for fever.  HENT: Negative for rhinorrhea and sore throat.   Eyes: Negative for redness.  Respiratory: Negative for cough.   Cardiovascular: Negative for chest pain.  Gastrointestinal: Negative for abdominal pain, diarrhea, nausea and vomiting.  Genitourinary: Negative for dysuria.  Musculoskeletal: Negative for myalgias.  Skin: Negative for rash.  Neurological: Positive for dizziness, facial asymmetry, speech difficulty and weakness. Negative for numbness and headaches.     Physical Exam Updated Vital Signs BP (!) 166/78   Pulse (!) 50   SpO2 100%   Physical Exam  Constitutional: He is oriented to person, place, and time. He appears well-developed and well-nourished.  HENT:    Head: Normocephalic and atraumatic.  Right Ear: Tympanic membrane, external ear and ear canal normal.  Left Ear: Tympanic membrane, external ear and ear canal normal.  Nose: Nose normal.  Mouth/Throat: Uvula is midline, oropharynx is clear and moist and mucous membranes are normal.  Eyes: Pupils are equal, round, and reactive to light. Conjunctivae, EOM and lids are normal.  Neck: Normal range of motion. Neck supple.  Cardiovascular: Normal rate and regular rhythm.  Pulmonary/Chest: Effort normal and breath sounds normal.  Abdominal: Soft. There is no tenderness.  Musculoskeletal: Normal range of motion.       Cervical back: He exhibits normal range of motion, no tenderness and no bony tenderness.  Neurological: He is alert and oriented to person, place, and time. He has normal strength and normal reflexes. No cranial nerve deficit or sensory deficit. He exhibits normal muscle tone. He displays a negative Romberg sign. Coordination and gait normal. GCS eye subscore is 4. GCS verbal subscore is 5. GCS motor subscore is 6.  Skin: Skin is warm and dry.  Psychiatric: He has a normal mood and affect.  Nursing note and vitals reviewed.    ED Treatments / Results  Labs (all labs ordered are listed, but only abnormal results are displayed) Labs Reviewed  COMPREHENSIVE METABOLIC PANEL - Abnormal; Notable for the following components:      Result Value   Glucose, Bld 114 (*)    Total Protein 6.3 (*)    All other components within normal limits  CBC  PROTIME-INR  URINALYSIS, ROUTINE W REFLEX MICROSCOPIC    EKG None  Radiology Ct Head Wo Contrast  Result Date: 09/16/2018 CLINICAL DATA:  Left hemiparesis and slurred speech lasting 5-10 minutes. EXAM: CT HEAD WITHOUT CONTRAST TECHNIQUE: Contiguous axial images were obtained from the base of the skull through the vertex without intravenous contrast. COMPARISON:  09/13/2018 FINDINGS: Brain: There is no evidence of acute infarct, intracranial  hemorrhage, midline shift, or extra-axial fluid collection. There is mild cerebral atrophy. Patchy cerebral white matter hypodensities are unchanged and nonspecific but compatible with moderate chronic small vessel ischemic disease. A small chronic infarct in the left corona radiata extending to the lentiform nucleus is unchanged. A lacunar infarct in the right thalamus was also present previously, subacute to chronic in nature. 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 Vascular: Calcified atherosclerosis at the skull base. No hyperdense vessel. Skull: No fracture or focal osseous lesion. Sinuses/Orbits: The visualized paranasal sinuses are clear. There is underpneumatization of the mastoids bilaterally. The orbits are unremarkable. Other: None. IMPRESSION: 1. No evidence of acute intracranial abnormality. 2. Moderate chronic small vessel ischemic disease. Electronically Signed   By: Logan Bores M.D.   On: 09/16/2018 18:24    Procedures Procedures (including critical care time)  Medications Ordered in ED Medications - No data to display   Initial Impression / Assessment and Plan / ED Course  I have reviewed the triage vital signs and the nursing notes.  Pertinent labs & imaging results that were available during my care of the patient were reviewed by me and considered in my medical decision making (see chart for details).     Patient seen and examined. Work-up initiated. Medications ordered. EKG reviewed.   ED ECG REPORT   Date: 09/16/2018  Rate: 50  Rhythm: atrial paced  QRS Axis: left  Intervals: normal  ST/T Wave abnormalities: normal  Conduction Disutrbances:none  Narrative Interpretation:   Old EKG Reviewed: unchanged  I have personally reviewed the EKG tracing and agree with the computerized printout as noted.   Vital signs reviewed and are as follows: BP (!) 173/77   Pulse (!) 51   Temp 97.8 F (36.6 C) (Oral)   Resp 13   SpO2  98%   5:39 PM Patient discussed with and seen by neurology.   Will complete Plavix load here with 225mg  and asa 81mg .   Will complete head CT to ensure stability. If patient continues to be asymptomatic, plan is for d/c to home on these medications and follow-up as previously planned.   Patient counseled to return if they have weakness in their arms or legs, slurred speech, trouble walking or talking, confusion, trouble with their balance, or if they have any other concerns. Patient verbalizes understanding and agrees with plan.    Final Clinical Impressions(s) / ED Diagnoses   Final diagnoses:  TIA (transient ischemic attack)  Hypertension, unspecified type   Patient with sx suggestive of TIA, now resolved. Recent inpatient work-up.  Symptoms remained resolved in the emergency department.  Instructions to maximize medical therapy, discharged home at this time.  ED Discharge Orders    None       Carlisle Cater, Hershal Coria 09/16/18 2305    Pattricia Boss, MD 09/18/18 1026

## 2019-10-10 IMAGING — CT CT HEAD W/O CM
4 series · 16 of 47 positions shown, 18 images · non-contrast
Comparison: None.

CLINICAL DATA: Lightheaded, fall

EXAM:
CT HEAD WITHOUT CONTRAST
TECHNIQUE: Contiguous axial images were obtained from the base of the skull
through the vertex without intravenous contrast.

[Series 3: head wo · axial · 0.42mm/px · z∈[-148,-42]mm · 7 of 29 slices shown, 9 images]
[im 4/29  brain]
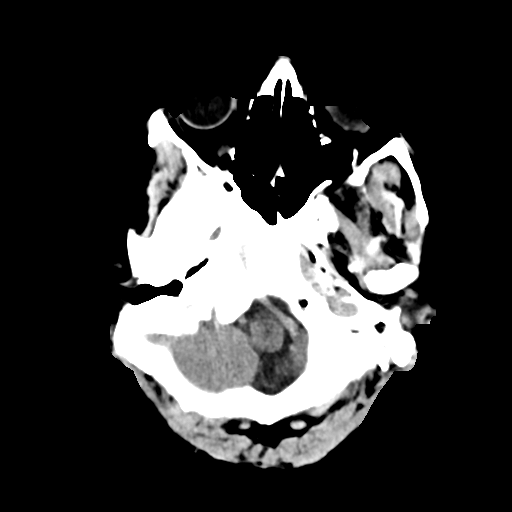
[im 4/29  bone]
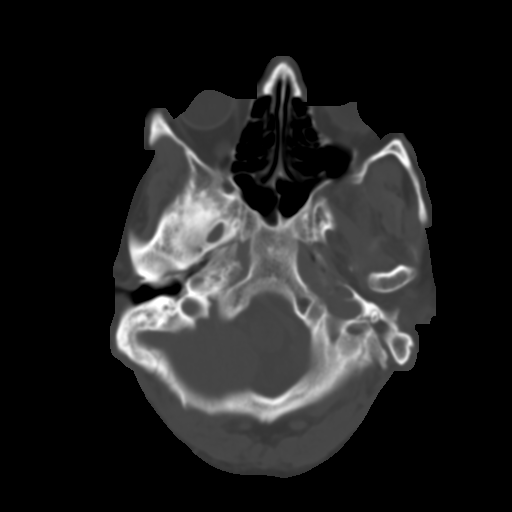
[im 8/29  brain]
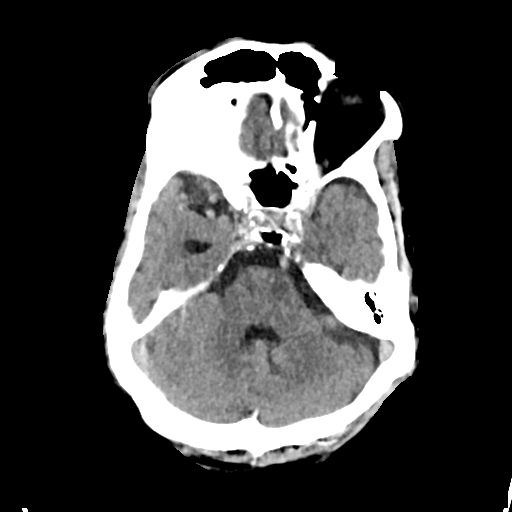
[im 11/29  brain]
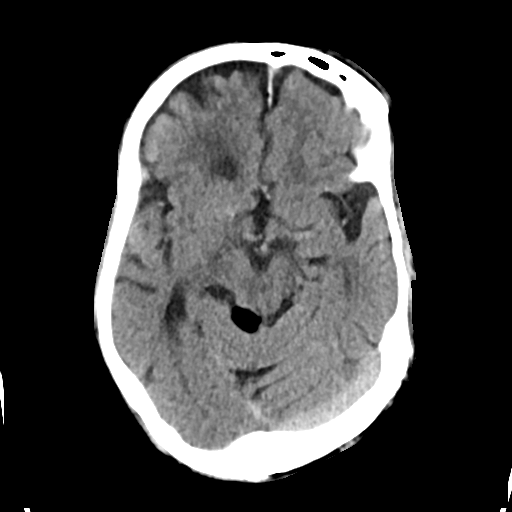
[im 15/29  brain]
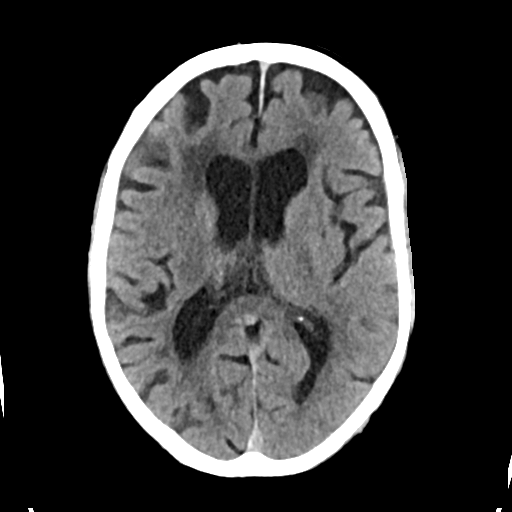
[im 18/29  brain]
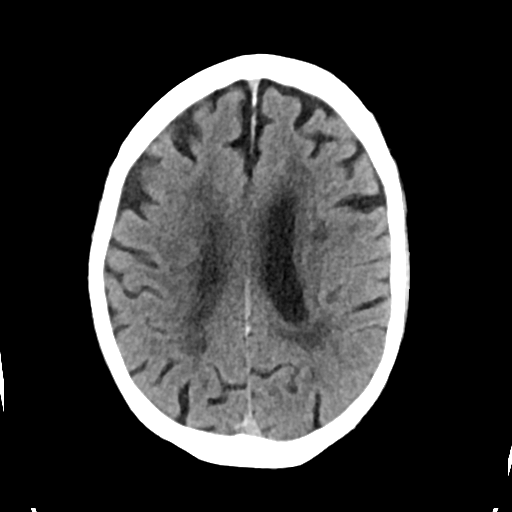
[im 18/29  bone]
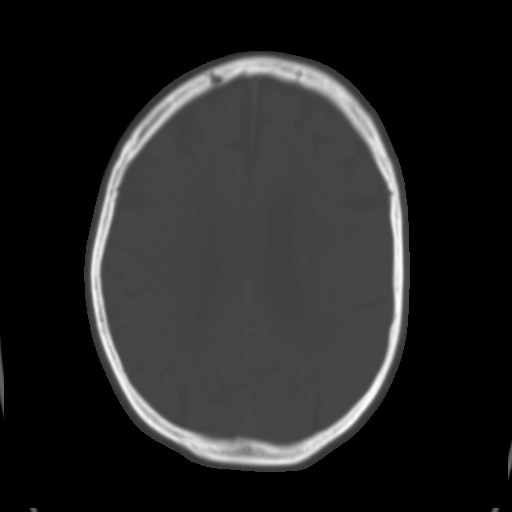
[im 22/29  brain]
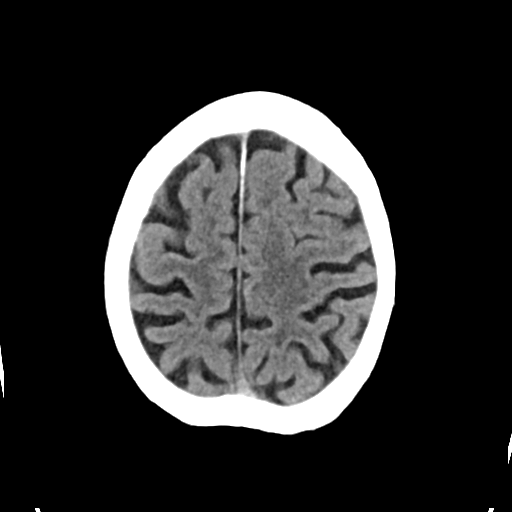
[im 25/29  brain]
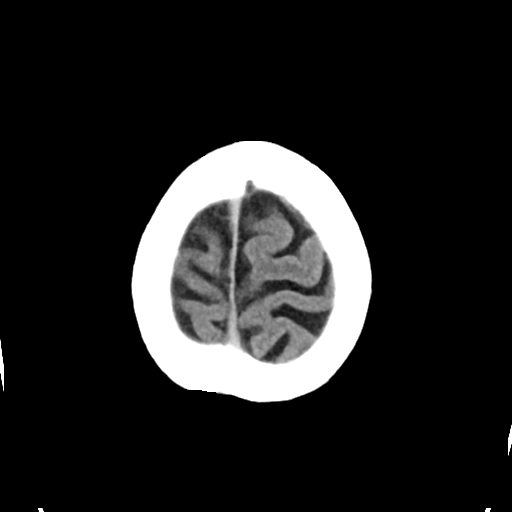

[Series 4: head bone · axial · 0.42mm/px · z∈[-148,-120]mm · 3 of 71 slices shown]
[im 8/71  bone]
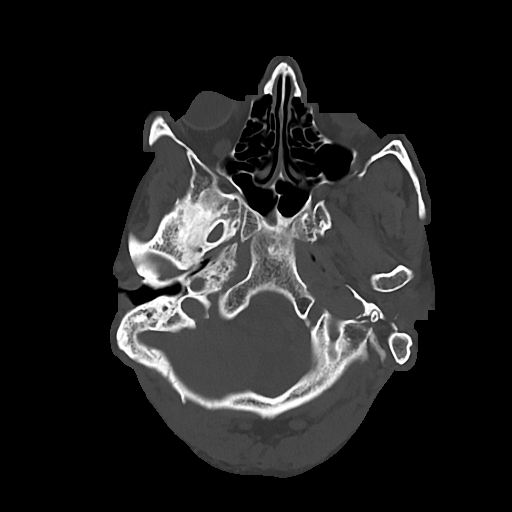
[im 15/71  bone]
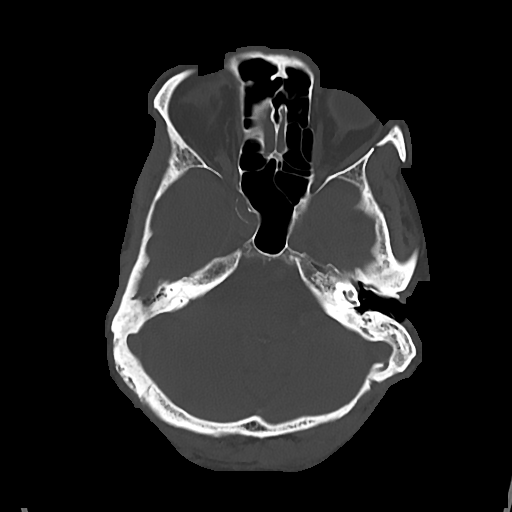
[im 22/71  bone]
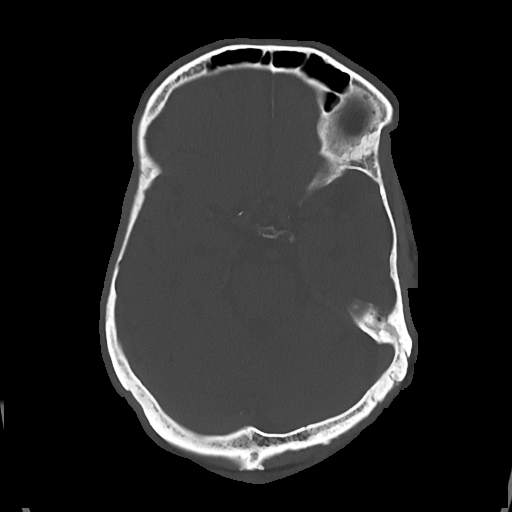

[Series 5: cor soft · coronal · 0.30mm/px · 3 of 62 slices shown]
[im 21/62  brain]
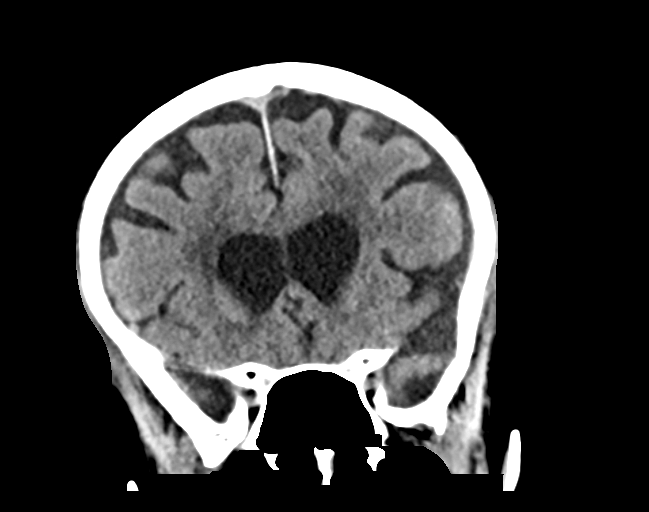
[im 28/62  brain]
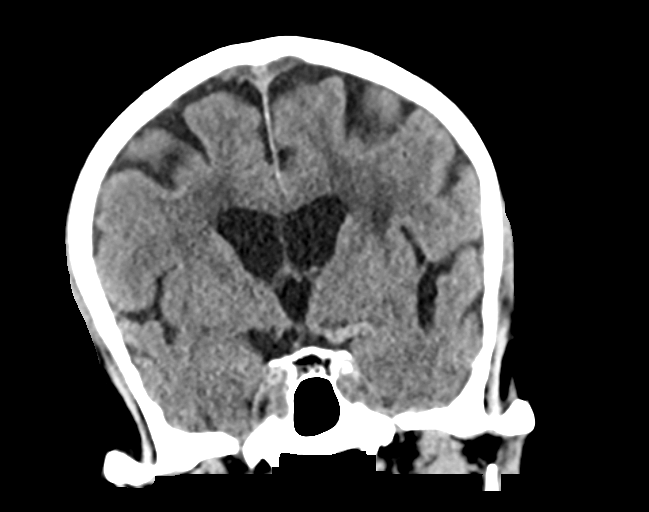
[im 34/62  brain]
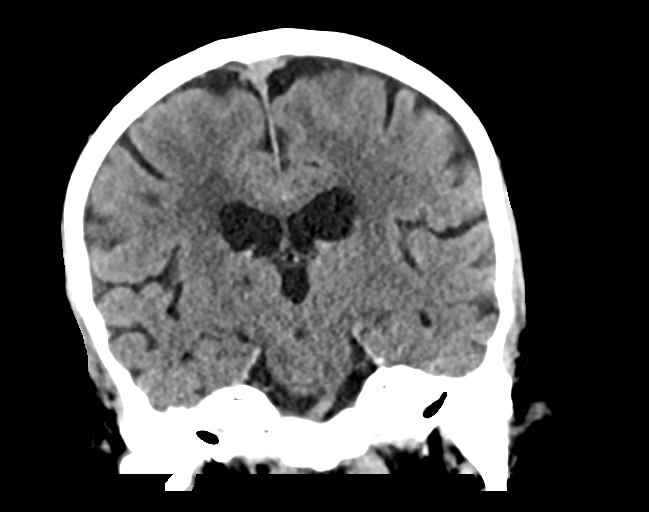

[Series 6: sag soft · sagittal · 0.30mm/px · 3 of 49 slices shown]
[im 17/49  brain]
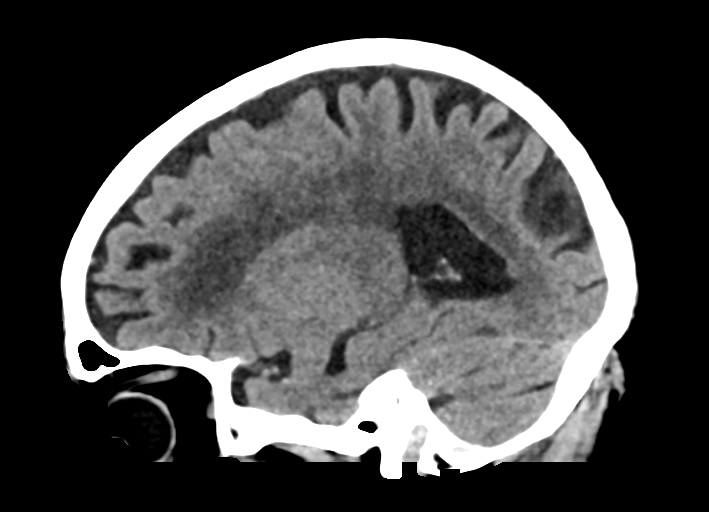
[im 25/49  brain]
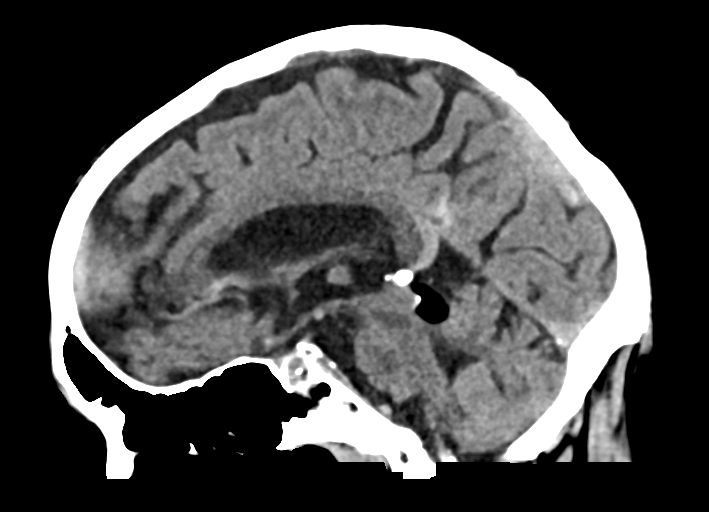
[im 33/49  brain]
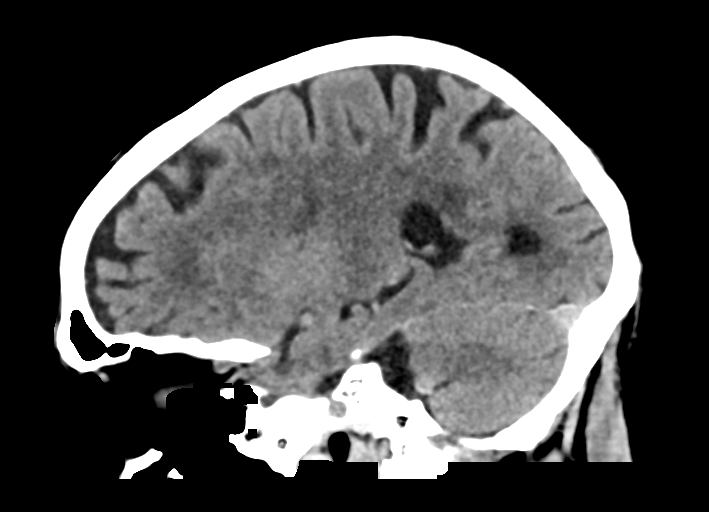

[16 of 47 positions shown; findings below may reference images not displayed]

FINDINGS: Brain: No evidence of acute infarction, hemorrhage, hydrocephalus,
extra-axial collection or mass lesion/mass effect.

Subcortical white matter and periventricular small vessel ischemic
changes. Global cortical atrophy.

Old left basal ganglia lacunar infarct.

Vascular: Intracranial atherosclerosis.

Skull: Normal. Negative for fracture or focal lesion.

Sinuses/Orbits: The visualized paranasal sinuses are essentially
clear. The mastoid air cells are unopacified.

Other: None.
IMPRESSION: No evidence of acute intracranial abnormality.

Atrophy with small vessel ischemic changes. Old left basal ganglia
lacunar infarct.
